# Patient Record
Sex: Female | Born: 1965 | Race: Black or African American | Hispanic: No | Smoking: Former smoker
Health system: Southern US, Community
[De-identification: ages and names within clinical notes are randomized; demographics above are authoritative.]

## PROBLEM LIST (undated history)

## (undated) DIAGNOSIS — E785 Hyperlipidemia, unspecified: Secondary | ICD-10-CM

## (undated) DIAGNOSIS — E119 Type 2 diabetes mellitus without complications: Secondary | ICD-10-CM

## (undated) DIAGNOSIS — I10 Essential (primary) hypertension: Secondary | ICD-10-CM

## (undated) DIAGNOSIS — K219 Gastro-esophageal reflux disease without esophagitis: Secondary | ICD-10-CM

## (undated) DIAGNOSIS — D219 Benign neoplasm of connective and other soft tissue, unspecified: Secondary | ICD-10-CM

## (undated) DIAGNOSIS — D649 Anemia, unspecified: Secondary | ICD-10-CM

## (undated) HISTORY — PX: WISDOM TOOTH EXTRACTION: SHX21

## (undated) HISTORY — PX: MYOMECTOMY: SHX85

## (undated) HISTORY — PX: ABDOMINAL HYSTERECTOMY: SHX81

---

## 2011-07-12 ENCOUNTER — Other Ambulatory Visit (HOSPITAL_COMMUNITY): Payer: Self-pay | Admitting: Internal Medicine

## 2011-07-12 DIAGNOSIS — Z1231 Encounter for screening mammogram for malignant neoplasm of breast: Secondary | ICD-10-CM

## 2011-08-02 ENCOUNTER — Ambulatory Visit (HOSPITAL_COMMUNITY)
Admission: RE | Admit: 2011-08-02 | Discharge: 2011-08-02 | Disposition: A | Discharge status: Home / Self Care | Payer: 59 | Source: Ambulatory Visit | Attending: Internal Medicine | Admitting: Internal Medicine

## 2011-08-02 DIAGNOSIS — Z1231 Encounter for screening mammogram for malignant neoplasm of breast: Secondary | ICD-10-CM | POA: Insufficient documentation

## 2012-08-15 ENCOUNTER — Other Ambulatory Visit (HOSPITAL_COMMUNITY): Payer: Self-pay | Admitting: Internal Medicine

## 2012-08-15 DIAGNOSIS — Z1231 Encounter for screening mammogram for malignant neoplasm of breast: Secondary | ICD-10-CM

## 2012-08-23 ENCOUNTER — Ambulatory Visit (HOSPITAL_COMMUNITY): Payer: 59

## 2012-09-10 ENCOUNTER — Ambulatory Visit (HOSPITAL_COMMUNITY)
Admission: RE | Admit: 2012-09-10 | Discharge: 2012-09-10 | Disposition: A | Discharge status: Home / Self Care | Payer: 59 | Source: Ambulatory Visit | Attending: Internal Medicine | Admitting: Internal Medicine

## 2012-09-10 DIAGNOSIS — Z1231 Encounter for screening mammogram for malignant neoplasm of breast: Secondary | ICD-10-CM | POA: Insufficient documentation

## 2012-09-18 ENCOUNTER — Other Ambulatory Visit: Payer: Self-pay | Admitting: Internal Medicine

## 2012-09-18 DIAGNOSIS — R928 Other abnormal and inconclusive findings on diagnostic imaging of breast: Secondary | ICD-10-CM

## 2012-09-19 HISTORY — PX: BREAST BIOPSY: SHX20

## 2012-09-27 ENCOUNTER — Ambulatory Visit (INDEPENDENT_AMBULATORY_CARE_PROVIDER_SITE_OTHER): Payer: Self-pay | Admitting: General Surgery

## 2012-09-28 ENCOUNTER — Ambulatory Visit
Admission: RE | Admit: 2012-09-28 | Discharge: 2012-09-28 | Disposition: A | Discharge status: Home / Self Care | Payer: Managed Care, Other (non HMO) | Source: Ambulatory Visit | Attending: Internal Medicine | Admitting: Internal Medicine

## 2012-09-28 ENCOUNTER — Other Ambulatory Visit: Payer: Self-pay | Admitting: Internal Medicine

## 2012-09-28 DIAGNOSIS — R928 Other abnormal and inconclusive findings on diagnostic imaging of breast: Secondary | ICD-10-CM

## 2012-10-19 ENCOUNTER — Ambulatory Visit
Admission: RE | Admit: 2012-10-19 | Discharge: 2012-10-19 | Disposition: A | Discharge status: Home / Self Care | Payer: Managed Care, Other (non HMO) | Source: Ambulatory Visit | Attending: Internal Medicine | Admitting: Internal Medicine

## 2012-10-19 DIAGNOSIS — R928 Other abnormal and inconclusive findings on diagnostic imaging of breast: Secondary | ICD-10-CM

## 2013-03-04 ENCOUNTER — Ambulatory Visit (INDEPENDENT_AMBULATORY_CARE_PROVIDER_SITE_OTHER): Payer: Self-pay | Admitting: General Surgery

## 2014-05-07 ENCOUNTER — Other Ambulatory Visit: Payer: Self-pay

## 2014-05-07 DIAGNOSIS — Z1231 Encounter for screening mammogram for malignant neoplasm of breast: Secondary | ICD-10-CM

## 2014-05-21 ENCOUNTER — Ambulatory Visit: Payer: Managed Care, Other (non HMO)

## 2014-05-21 ENCOUNTER — Ambulatory Visit
Admission: RE | Admit: 2014-05-21 | Discharge: 2014-05-21 | Disposition: A | Discharge status: Home / Self Care | Payer: Managed Care, Other (non HMO) | Source: Ambulatory Visit

## 2014-05-21 DIAGNOSIS — Z1231 Encounter for screening mammogram for malignant neoplasm of breast: Secondary | ICD-10-CM

## 2015-04-29 ENCOUNTER — Other Ambulatory Visit: Payer: Self-pay

## 2015-04-29 DIAGNOSIS — Z1231 Encounter for screening mammogram for malignant neoplasm of breast: Secondary | ICD-10-CM

## 2015-05-26 ENCOUNTER — Ambulatory Visit: Payer: Managed Care, Other (non HMO)

## 2015-05-29 ENCOUNTER — Ambulatory Visit: Payer: Managed Care, Other (non HMO)

## 2015-07-08 ENCOUNTER — Ambulatory Visit
Admission: RE | Admit: 2015-07-08 | Discharge: 2015-07-08 | Disposition: A | Discharge status: Home / Self Care | Payer: Managed Care, Other (non HMO) | Source: Ambulatory Visit

## 2015-07-08 DIAGNOSIS — Z1231 Encounter for screening mammogram for malignant neoplasm of breast: Secondary | ICD-10-CM

## 2016-04-29 ENCOUNTER — Other Ambulatory Visit: Payer: Self-pay | Admitting: Obstetrics and Gynecology

## 2016-05-03 LAB — CYTOLOGY - PAP

## 2016-09-02 ENCOUNTER — Other Ambulatory Visit: Payer: Self-pay | Admitting: Obstetrics and Gynecology

## 2016-09-02 DIAGNOSIS — Z1231 Encounter for screening mammogram for malignant neoplasm of breast: Secondary | ICD-10-CM

## 2016-09-08 ENCOUNTER — Ambulatory Visit
Admission: RE | Admit: 2016-09-08 | Discharge: 2016-09-08 | Disposition: A | Discharge status: Home / Self Care | Payer: Managed Care, Other (non HMO) | Source: Ambulatory Visit | Attending: Obstetrics and Gynecology | Admitting: Obstetrics and Gynecology

## 2016-09-08 DIAGNOSIS — Z1231 Encounter for screening mammogram for malignant neoplasm of breast: Secondary | ICD-10-CM

## 2016-11-09 ENCOUNTER — Other Ambulatory Visit: Payer: Self-pay | Admitting: Obstetrics and Gynecology

## 2016-11-10 ENCOUNTER — Other Ambulatory Visit: Payer: Self-pay | Admitting: Obstetrics and Gynecology

## 2016-11-10 DIAGNOSIS — D251 Intramural leiomyoma of uterus: Secondary | ICD-10-CM

## 2017-08-23 ENCOUNTER — Other Ambulatory Visit: Payer: Self-pay | Admitting: Obstetrics and Gynecology

## 2017-08-23 DIAGNOSIS — Z1231 Encounter for screening mammogram for malignant neoplasm of breast: Secondary | ICD-10-CM

## 2017-09-22 ENCOUNTER — Ambulatory Visit
Admission: RE | Admit: 2017-09-22 | Discharge: 2017-09-22 | Disposition: A | Discharge status: Home / Self Care | Payer: No Typology Code available for payment source | Source: Ambulatory Visit | Attending: Obstetrics and Gynecology | Admitting: Obstetrics and Gynecology

## 2017-09-22 DIAGNOSIS — Z1231 Encounter for screening mammogram for malignant neoplasm of breast: Secondary | ICD-10-CM

## 2018-04-09 ENCOUNTER — Ambulatory Visit: Payer: Self-pay | Admitting: General Surgery

## 2018-04-27 ENCOUNTER — Encounter: Payer: Self-pay | Admitting: Physician Assistant

## 2018-09-05 ENCOUNTER — Other Ambulatory Visit: Payer: Self-pay

## 2018-09-05 ENCOUNTER — Emergency Department (HOSPITAL_COMMUNITY)
Admission: EM | Admit: 2018-09-05 | Discharge: 2018-09-06 | Disposition: A | Discharge status: Home / Self Care | Payer: 59 | Attending: Emergency Medicine | Admitting: Emergency Medicine

## 2018-09-05 ENCOUNTER — Emergency Department (HOSPITAL_COMMUNITY): Payer: 59

## 2018-09-05 ENCOUNTER — Encounter (HOSPITAL_COMMUNITY): Payer: Self-pay | Admitting: Emergency Medicine

## 2018-09-05 DIAGNOSIS — Y92810 Car as the place of occurrence of the external cause: Secondary | ICD-10-CM | POA: Diagnosis not present

## 2018-09-05 DIAGNOSIS — Y998 Other external cause status: Secondary | ICD-10-CM | POA: Insufficient documentation

## 2018-09-05 DIAGNOSIS — S6992XA Unspecified injury of left wrist, hand and finger(s), initial encounter: Secondary | ICD-10-CM

## 2018-09-05 DIAGNOSIS — S62613B Displaced fracture of proximal phalanx of left middle finger, initial encounter for open fracture: Secondary | ICD-10-CM | POA: Diagnosis not present

## 2018-09-05 DIAGNOSIS — Y9389 Activity, other specified: Secondary | ICD-10-CM | POA: Insufficient documentation

## 2018-09-05 DIAGNOSIS — W231XXA Caught, crushed, jammed, or pinched between stationary objects, initial encounter: Secondary | ICD-10-CM | POA: Insufficient documentation

## 2018-09-05 DIAGNOSIS — S62633B Displaced fracture of distal phalanx of left middle finger, initial encounter for open fracture: Secondary | ICD-10-CM

## 2018-09-05 MED ORDER — TETANUS-DIPHTH-ACELL PERTUSSIS 5-2.5-18.5 LF-MCG/0.5 IM SUSP
0.5000 mL | Freq: Once | INTRAMUSCULAR | Status: AC
  Administered 2018-09-05: 0.5 mL via INTRAMUSCULAR
  Filled 2018-09-05: qty 0.5

## 2018-09-05 MED ORDER — CEFAZOLIN SODIUM-DEXTROSE 2-4 GM/100ML-% IV SOLN
2.0000 g | Freq: Once | INTRAVENOUS | Status: AC
Start: 2018-09-06 — End: 2018-09-06
  Administered 2018-09-06: 2 g via INTRAVENOUS
  Filled 2018-09-05: qty 100

## 2018-09-05 MED ORDER — LIDOCAINE HCL 2 % IJ SOLN
15.0000 mL | Freq: Once | INTRAMUSCULAR | Status: AC
  Administered 2018-09-05: 300 mg
  Filled 2018-09-05: qty 20

## 2018-09-05 NOTE — ED Triage Notes (Signed)
Pt arriving POV with injury to left middle finger. Pt states she got her finger stuck between her driver seat and the center console. Nail is bent up from middle to cuticle area.

## 2018-09-05 NOTE — ED Provider Notes (Signed)
[facility] DEPT Provider Note   CSN: [id number] Arrival date & time: 09/05/18  2027    History   Chief Complaint Chief Complaint  Patient presents with  . Finger Injury    HPI Patricia Mckee is a 52 y.o. female.  52 year old right-hand-dominant female with no significant past medical history presents to the emergency department for injury to her left third finger.  States that she got her finger stuck between the driver seat and her center console.  Felt the nail, and contact with a sharp metal edge.  Has had a burning pain which has waxed and waned in severity.  Notes disruption to her nail.  Injury has remained constant with no medications taken prior to arrival.  She is not on chronic anticoagulation.  No sensation changes such as numbness or paresthesias.  She cannot recall the date of her last tetanus shot.     History reviewed. No pertinent past medical history.  There are no active problems to display for this patient.   History reviewed. No pertinent surgical history.   OB History   No obstetric history on file.      Home Medications    Prior to Admission medications   Medication Sig Start Date End Date Taking? Authorizing Provider  ibuprofen (ADVIL,MOTRIN) 200 MG tablet Take 200 mg by mouth every 6 (six) hours as needed for moderate pain.   Yes [provider]  losartan-hydrochlorothiazide (HYZAAR) 100-25 MG tablet Take 1 tablet by mouth daily.   Yes [provider]  cephALEXin (KEFLEX) 500 MG capsule Take 1 capsule (500 mg total) by mouth 4 (four) times daily. 09/06/18   Antonietta Breach, PA-C  HYDROcodone-acetaminophen (NORCO/VICODIN) 5-325 MG tablet Take 1 tablet by mouth every 6 (six) hours as needed for severe pain. 09/06/18   Antonietta Breach, PA-C    Family History No family history on file.  Social History Social History   Tobacco Use  . Smoking status: Not on file  Substance Use Topics  . Alcohol  use: Not on file  . Drug use: Not on file     Allergies   Patient has no known allergies.   Review of Systems Review of Systems Ten systems reviewed and are negative for acute change, except as noted in the HPI.    Physical Exam Updated Vital Signs BP 132/89   Pulse 87   Temp 98 F (36.7 C) (Oral)   Resp 18   SpO2 100%   Physical Exam Vitals signs and nursing note reviewed.  Constitutional:      General: She is not in acute distress.    Appearance: She is well-developed. She is not diaphoretic.     Comments: Nontoxic appearing and in NAD  HENT:     Head: Normocephalic and atraumatic.  Eyes:     General: No scleral icterus.    Conjunctiva/sclera: Conjunctivae normal.  Neck:     Musculoskeletal: Normal range of motion.  Cardiovascular:     Rate and Rhythm: Normal rate and regular rhythm.     Pulses: Normal pulses.     Comments: Distal radial pulse 2+ in the LUE. Capillary refill brisk in all digits of the L hand. Pulmonary:     Effort: Pulmonary effort is normal. No respiratory distress.     Comments: Respirations even and unlabored Musculoskeletal: Normal range of motion.     Comments: Complete disruption of the nail body, root, and nail matrix of the L 3rd finger. There is bleeding around  the eponychium. 1cm laceration to the fat pad of the affected digit as well with slow bleeding. See imaging below for further detail.  Skin:    General: Skin is warm and dry.     Coloration: Skin is not pale.     Findings: No erythema or rash.  Neurological:     Mental Status: She is alert and oriented to person, place, and time.     Comments: Sensation to light touch intact in all digits of the L hand.  Psychiatric:        Behavior: Behavior normal.           ED Treatments / Results  Labs (all labs ordered are listed, but only abnormal results are displayed) Labs Reviewed - No data to display  EKG None  Radiology Dg Finger Middle Left  Result Date:  09/05/2018 CLINICAL DATA:  Injury to the left middle finger. Finger was stuck between driver seat and center console. EXAM: LEFT MIDDLE FINGER 2+V COMPARISON:  None. FINDINGS: Transverse fracture of the midportion distal phalanx left third finger. There is dorsal displacement and volar angulation of the distal fracture fragment. Evaluation of soft tissues is limited by overlying gauze material but there appears to be a soft tissue laceration in the distal finger as well. No evidence of intra-articular involvement. No displacement at the joints. IMPRESSION: Transverse fracture of the midportion distal phalanx left third finger with dorsal displacement and volar angulation of the distal fracture fragment. Electronically Signed   By: Lucienne Capers M.D.   On: 09/05/2018 22:46    Procedures Procedures (including critical care time)  Medications Ordered in ED Medications  Tdap (BOOSTRIX) injection 0.5 mL (0.5 mLs Intramuscular Given 09/05/18 2306)  lidocaine (XYLOCAINE) 2 % (with pres) injection 300 mg (300 mg Infiltration Given by Other 09/05/18 2305)  ceFAZolin (ANCEF) IVPB 2g/100 mL premix (0 g Intravenous Stopped 09/06/18 0304)     Initial Impression / Assessment and Plan / ED Course  I have reviewed the triage vital signs and the nursing notes.  Pertinent labs & imaging results that were available during my care of the patient were reviewed by me and considered in my medical decision making (see chart for details).       10:20 PM Patient presenting for injury to L 3rd middle finger after getting her car stuck between her seat and center console. Obvious nailbed injury. Patient neurovascularly intact. Brisk capillary refill. Will obtain Xray and update Tdap.  10:52 PM Xray with evidence of transverse fracture to the distal phalanx; displaced. Evidence that fracture is open. Will consult with hand surgery regarding care and management. Order placed.  11:40 PM Case discussed with Dr.  Grandville Silos of hand surgery who will review images and xray and call back with management recommendations.  11:53 PM Dr. Grandville Silos has reviewed the patient's images and xrays. Advises vigorous wash out with digital block and reduction of displaced nail and fracture fragment with splinting. Will follow up with patient in the office at [facility] tomorrow. Patient to be given Ancef prior to discharge as well as course of Keflex 500mg  QID.  1:12 AM Attempted reduction for an hour without success. Also attempted with Johnney Killian, MD. Dr. Grandville Silos made aware. Will see patient in the ED.  2:00 AM Dr. Grandville Silos has repaired the patient's complex injury at bedside. Plan for d/c after Ancef on course of Keflex QID. She will plan to see Dr. Grandville Silos in outpatient follow up during the week of 09/17/18.   Final  Clinical Impressions(s) / ED Diagnoses   Final diagnoses:  Displaced fracture of distal phalanx of left middle finger, initial encounter for open fracture  Injury of nail bed of finger of left hand, initial encounter    ED Discharge Orders         Ordered    cephALEXin (KEFLEX) 500 MG capsule  4 times daily     09/06/18 0253    HYDROcodone-acetaminophen (NORCO/VICODIN) 5-325 MG tablet  Every 6 hours PRN     09/06/18 0253           Antonietta Breach, PA-C 09/06/18 0327    Sherwood Gambler, MD 09/08/18 [phone]

## 2018-09-06 MED ORDER — HYDROCODONE-ACETAMINOPHEN 5-325 MG PO TABS: 1.0000 | ORAL_TABLET | Freq: Four times a day (QID) | ORAL | 0 refills | Status: DC | PRN

## 2018-09-06 MED ORDER — CEPHALEXIN 500 MG PO CAPS: 500.0000 mg | ORAL_CAPSULE | Freq: Four times a day (QID) | ORAL | 0 refills | Status: DC

## 2018-09-06 NOTE — Discharge Instructions (Addendum)
Discharge Instructions   You have a dressing with a plaster splint incorporated in it. Move your fingers like I showed you Leave the dressing in place until you return to our office.  You may shower, but keep the bandage clean & dry.  You may drive a car when you are off of prescription pain medications and can safely control your vehicle with both hands. Take Keflex as prescribed until finished Our office will call you to arrange follow-up   Please call [phone] during normal business hours or [phone] after hours for any problems. Including the following:  - excessive redness of the incisions - drainage for more than 4 days - fever of more than 101.5 F  *Please note that pain medications will not be refilled after hours or on weekends.

## 2018-09-06 NOTE — Consult Note (Signed)
ORTHOPAEDIC CONSULTATION HISTORY & PHYSICAL REQUESTING PHYSICIAN: No att. providers found  Chief Complaint: Left long fingertip injury  HPI: Patricia Mckee is a 52 y.o. female who presented to the emergency department for evaluation, after left long finger was injured between the seat and the center console of her vehicle.  She was noted to have a nailbed injury with some disruption and underlying P3 fracture.  Attempted closed reduction by ED staff was unsuccessful.  Tetanus has been updated.  She is receiving a dose of IV antibiotics.  History reviewed. No pertinent past medical history. History reviewed. No pertinent surgical history. Social History   Socioeconomic History  . Marital status: Married    Spouse name: Not on file  . Number of children: Not on file  . Years of education: Not on file  . Highest education level: Not on file  Occupational History  . Not on file  Social Needs  . Financial resource strain: Not on file  . Food insecurity:    Worry: Not on file    Inability: Not on file  . Transportation needs:    Medical: Not on file    Non-medical: Not on file  Tobacco Use  . Smoking status: Not on file  Substance and Sexual Activity  . Alcohol use: Not on file  . Drug use: Not on file  . Sexual activity: Not on file  Lifestyle  . Physical activity:    Days per week: Not on file    Minutes per session: Not on file  . Stress: Not on file  Relationships  . Social connections:    Talks on phone: Not on file    Gets together: Not on file    Attends religious service: Not on file    Active member of club or organization: Not on file    Attends meetings of clubs or organizations: Not on file    Relationship status: Not on file  Other Topics Concern  . Not on file  Social History Narrative  . Not on file   No family history on file. No Known Allergies Prior to Admission medications   Medication Sig Start Date End Date Taking? Authorizing Provider    ibuprofen (ADVIL,MOTRIN) 200 MG tablet Take 200 mg by mouth every 6 (six) hours as needed for moderate pain.   Yes [provider]  losartan-hydrochlorothiazide (HYZAAR) 100-25 MG tablet Take 1 tablet by mouth daily.   Yes [provider]   Dg Finger Middle Left  Result Date: 09/05/2018 CLINICAL DATA:  Injury to the left middle finger. Finger was stuck between driver seat and center console. EXAM: LEFT MIDDLE FINGER 2+V COMPARISON:  None. FINDINGS: Transverse fracture of the midportion distal phalanx left third finger. There is dorsal displacement and volar angulation of the distal fracture fragment. Evaluation of soft tissues is limited by overlying gauze material but there appears to be a soft tissue laceration in the distal finger as well. No evidence of intra-articular involvement. No displacement at the joints. IMPRESSION: Transverse fracture of the midportion distal phalanx left third finger with dorsal displacement and volar angulation of the distal fracture fragment. Electronically Signed   By: Lucienne Capers M.D.   On: 09/05/2018 22:46    Positive ROS: All other systems have been reviewed and were otherwise negative with the exception of those mentioned in the HPI and as above.  Physical Exam: Vitals: Refer to EMR. Constitutional:  WD, WN, NAD HEENT:  NCAT, EOMI Neuro/Psych:  Alert & oriented to  person, place, and time; appropriate mood & affect Lymphatic: No generalized extremity edema or lymphadenopathy Extremities / MSK:  The extremities are normal with respect to appearance, ranges of motion, joint stability, muscle strength/tone, sensation, & perfusion except as otherwise noted:  The digit is numb, already having had digital block with lidocaine.  The base of the nail is disrupted from underneath the eponychial fold, and resting on top of it.  The normal nail plate is covered with an acrylic nail.  The P3 fracture is visible.  Assessment: Displaced angulated  open P3 fracture of the left long finger, with overlying nailbed injury  Plan/Procedure: I discussed these findings with her and indicated the need for further treatment to affect cleansing, reduction, and stabilization of the fracture and repair of the nailbed.  She consented.  A tourniquet was applied to the base of the digit.  The digit was then prepped with Betadine and draped in usual sterile fashion.  The nail plate was then fully removed from the nailbed.  Once this was done, the wound and the fracture were copiously irrigated.  The distal edge of the eponychial fold was lifted to allow for reduction of the fracture and the nailbed placed grossly in its native position.  The germinal matrix actually had yielded at its very base.  For this reason, I treated 2 incisions through the eponychial fold, one medial and one lateral to allow for the fold to be further elevated proximally.  This having been done, 5-0 plain gut was used to secure the proximal aspect of the germinal matrix as far proximally as it would go, in its native position and this was secured with the single suture on either corner.  Once this was done, the eponychial fold was placed back into place and secured with 5-0 plain gut suture.  The nail plate that had been removed was then cleaned up, trimmed, and placed back on top of the nail matrix, advanced under the eponychial fold and secured to it with a suture.  The distal end was then secured with a spanning suture across to help hold it down since my needle would not perforate the acrylic nail.  This nicely allowed for the nail to provide a splint and stability for the underlying soft tissue repair given that there was a fracture.  Additionally, a 22-gauge hypodermic needle was driven to the tip of the digit intramedullary and into the base, but not across the DIP joint.  This provided further stability.  The hub of the needle was cut off and the needle bent back up over the acrylic nail.   The tourniquet was removed, the digit cleansed and dressed with Xeroform, gauze, and a tongue blade splint that allowed the PIP and MP joints to move.  She will be discharged home today with typical instructions, keeping the splint on, clean, and dry until returning to the office the week of 12-30.  Pain management strategies were reviewed and she will be discharged with oral antibiotics.  When she returns, there should be x-rays of the left long finger in the splint, and I then can carefully remove the splint to prevent disruption of the needle fixation.  Patricia Mckee, [place], [place]  [address] Office: [phone] Mobile: [phone]  09/06/2018, 2:29 AM

## 2018-09-06 NOTE — ED Notes (Addendum)
Was returning to the floor and met the pt in the hallway. Pt immediately expressed that she was frustrated with how long she had to wait. Stating "I shouldn't be waiting this long; someone should have taken the bandage off my finger sooner; I dont understand why nothing is being done." I explained to the pt that the PA was awaiting consultation from the hand specialist before she preceded forward with action. Pt was not satisfyed with the answer stating "that's an excuse, if I went anywhere else they I would have been treated faster." Pt also stated "I heard [facility] had a reputation, and I starting to understand it, and I will make sure others know too." Once again I explained to the pt that we were awaiting the consultation from the hand specialist and that she was not being ignored or forgotten. The PA  [facility] then showed up, and once again explained to the pt what the situation, and the delay in care.

## 2019-04-15 ENCOUNTER — Other Ambulatory Visit: Payer: Self-pay | Admitting: Obstetrics and Gynecology

## 2019-04-15 DIAGNOSIS — Z1231 Encounter for screening mammogram for malignant neoplasm of breast: Secondary | ICD-10-CM

## 2019-05-30 ENCOUNTER — Ambulatory Visit
Admission: RE | Admit: 2019-05-30 | Discharge: 2019-05-30 | Disposition: A | Discharge status: Home / Self Care | Payer: No Typology Code available for payment source | Source: Ambulatory Visit | Attending: Obstetrics and Gynecology | Admitting: Obstetrics and Gynecology

## 2019-05-30 ENCOUNTER — Other Ambulatory Visit: Payer: Self-pay

## 2019-05-30 DIAGNOSIS — Z1231 Encounter for screening mammogram for malignant neoplasm of breast: Secondary | ICD-10-CM

## 2019-06-03 ENCOUNTER — Ambulatory Visit: Payer: Self-pay | Admitting: General Surgery

## 2019-06-03 NOTE — H&P (Signed)
History of Present Illness Ralene Ok MD; 06/03/2019 10:28 AM) The patient is a 53 year old female who presents with an incisional hernia. Patient follow back up today secondary to incisional hernia. Patient states that secondary to That she put the partial hysterectomy and hernia repair. Patient states that she is ready to schedule surgery. Patient's had no change in the hernia. She has no signs or symptoms of incarceration or strangulation.  -------------------------------------------------------------- Referred by: Dr. Philis Pique Chief Complaint: Incisional hernia  Patient is a 53 year old female with a history of a robotic myomectomy approximately 10 years ago and was DC'd. Patient states that approximately 2 years after she did notice a small bulge at the suprapubic umbilical port site. Patient also developed further fibroids is to have a partial hysterectomy. Patient states that she has minimal discomfort from the hernia site. She states that she is able to reduce it manually. She is active and does do a lot of traveling with her work. She denies any signs or symptoms of incarceration or strangulation.  Patient Dr. Philis Pique plan on proceeding with robotic partial hysterectomy.    Allergies Emeline Gins, [place]; 06/03/2019 10:13 AM) No Known Drug Allergies  [04/09/2018]: Allergies Reconciled   Medication History Emeline Gins, CMA; 06/03/2019 10:13 AM) Losartan Potassium-HCTZ (100-25MG  Tablet, Oral) Active. Medications Reconciled    Review of Systems Ralene Ok, MD; 06/03/2019 10:29 AM) General Not Present- Appetite Loss, Chills, Fatigue, Fever, Night Sweats, Weight Gain and Weight Loss. Skin Not Present- Change in Wart/Mole, Dryness, Hives, Jaundice, New Lesions, Non-Healing Wounds, Rash and Ulcer. HEENT Present- Seasonal Allergies and Wears glasses/contact lenses. Not Present- Earache, Hearing Loss, Hoarseness, Nose Bleed, Oral Ulcers, Ringing in the Ears,  Sinus Pain, Sore Throat, Visual Disturbances and Yellow Eyes. Respiratory Not Present- Bloody sputum, Chronic Cough, Difficulty Breathing, Snoring and Wheezing. Breast Not Present- Breast Mass, Breast Pain, Nipple Discharge and Skin Changes. Cardiovascular Not Present- Chest Pain, Difficulty Breathing Lying Down, Leg Cramps, Palpitations, Rapid Heart Rate, Shortness of Breath and Swelling of Extremities. Gastrointestinal Present- Change in Bowel Habits. Not Present- Abdominal Pain, Bloating, Bloody Stool, Chronic diarrhea, Constipation, Difficulty Swallowing, Excessive gas, Gets full quickly at meals, Hemorrhoids, Indigestion, Nausea, Rectal Pain and Vomiting. Female Genitourinary Present- Frequency. Not Present- Nocturia, Painful Urination, Pelvic Pain and Urgency. Neurological Not Present- Decreased Memory, Fainting, Headaches, Numbness, Seizures, Tingling, Tremor, Trouble walking and Weakness. Psychiatric Not Present- Anxiety, Bipolar, Change in Sleep Pattern, Depression, Fearful and Frequent crying. Endocrine Not Present- Cold Intolerance, Excessive Hunger, Hair Changes, Heat Intolerance, Hot flashes and New Diabetes. Hematology Not Present- Blood Thinners, Easy Bruising, Excessive bleeding, Gland problems, HIV and Persistent Infections. All other systems negative  Vitals Emeline Gins CMA; 06/03/2019 10:13 AM) 06/03/2019 10:13 AM Weight: 186.8 lb Height: 67in Body Surface Area: 1.96 m Body Mass Index: 29.26 kg/m  Temp.: 97.52F  Pulse: 110 (Regular)  BP: 124/82(Sitting, Left Arm, Standard)       Physical Exam Ralene Ok MD; 06/03/2019 10:29 AM) The physical exam findings are as follows: Note: Constitutional: No acute distress, conversant, appears stated age  Eyes: Anicteric sclerae, moist conjunctiva, no lid lag  Neck: No thyromegaly, trachea midline, no cervical lymphadenopathy  Lungs: Clear to auscultation biilaterally, normal respiratory  effot  Cardiovascular: regular rate & rhythm, no murmurs, no peripheal edema, pedal pulses 2+  GI: Soft, no masses or hepatosplenomegaly, non-tender to palpation  MSK: Normal gait, no clubbing cyanosis, edema  Skin: No rashes, palpation reveals normal skin turgor  Psychiatric: Appropriate judgment and insight, oriented to  person, place, and time  Abdomen Inspection Hernias - Incisional - Reducible (In the midline superior to the umbilicus, about 1-2 cm) .    Assessment & Plan Ralene Ok MD; 06/03/2019 10:29 AM)  Fatima Blank HERNIA, WITHOUT OBSTRUCTION OR GANGRENE (K43.2) Impression: Patient is a 53 year old female with an incisional hernia, fibroids to undergo robotic partial hysterectomy. The patient will like to try to coordinate the incisional hernia repair with mesh the same time as the partial hysterectomy.  1. Will proceed to the operating room for laparoscopic versus robotic incisional hernia repair with mesh. 2. I discussed with the patient the signs and symptoms of incarceration and strangulation and the need to proceed to the ER should they occur.  3. I discussed with the patient the risks and benefits of the procedure to include but not limited to: Infection, bleeding, damage to surrounding structures, possible need for further surgery, possible nerve pain, and possible recurrence. The patient was understanding and wishes to proceed.

## 2019-06-12 ENCOUNTER — Encounter (HOSPITAL_BASED_OUTPATIENT_CLINIC_OR_DEPARTMENT_OTHER): Payer: Self-pay

## 2019-06-12 ENCOUNTER — Ambulatory Visit (HOSPITAL_BASED_OUTPATIENT_CLINIC_OR_DEPARTMENT_OTHER): Admit: 2019-06-12 | Payer: No Typology Code available for payment source | Admitting: Obstetrics and Gynecology

## 2019-06-12 SURGERY — XI ROBOTIC ASSISTED LAPAROSCOPIC HYSTERECTOMY AND SALPINGECTOMY
Anesthesia: Choice | Laterality: Bilateral

## 2019-08-27 ENCOUNTER — Ambulatory Visit (HOSPITAL_BASED_OUTPATIENT_CLINIC_OR_DEPARTMENT_OTHER): Admit: 2019-08-27 | Payer: No Typology Code available for payment source | Admitting: Obstetrics and Gynecology

## 2019-08-27 ENCOUNTER — Encounter (HOSPITAL_BASED_OUTPATIENT_CLINIC_OR_DEPARTMENT_OTHER): Payer: Self-pay

## 2019-08-27 SURGERY — XI ROBOTIC ASSISTED LAPAROSCOPIC HYSTERECTOMY AND SALPINGECTOMY
Anesthesia: Choice

## 2020-04-28 ENCOUNTER — Other Ambulatory Visit: Payer: Self-pay | Admitting: Obstetrics and Gynecology

## 2020-06-09 ENCOUNTER — Encounter (HOSPITAL_COMMUNITY): Payer: Self-pay

## 2020-06-09 NOTE — Progress Notes (Addendum)
COVID Vaccine Completed:  x2 Date COVID Vaccine completed:  12-12-19 & 01-08-20 COVID vaccine manufacturer: [place]   PCP - [facility] Cardiologist - N/a  Chest x-ray -  EKG - 06-10-20 in Epic Stress Test -  ECHO -  Cardiac Cath -  Pacemaker/ICD device last checked:  Sleep Study -  CPAP -   Fasting Blood Sugar - 124-148 Checks Blood Sugar -two times a day  Blood Thinner Instructions: Aspirin Instructions:  ASA 81 mg.  Pt to check with surgeon Last Dose:  Anesthesia review:   Patient denies shortness of breath, fever, cough and chest pain at PAT appointment   Patient verbalized understanding of instructions that were given to them at the PAT appointment. Patient was also instructed that they will need to review over the PAT instructions again at home before surgery.

## 2020-06-09 NOTE — Patient Instructions (Addendum)
DUE TO COVID-19 ONLY ONE VISITOR IS ALLOWED IN WAITING ROOM (VISITOR WILL HAVE A TEMPERATURE CHECK ON ARRIVAL AND MUST WEAR A FACE MASK THE ENTIRE TIME.)  ONCE YOU ARE ADMITTED TO YOUR PRIVATE ROOM, THE SAME ONE VISITOR IS ALLOWED TO VISIT DURING VISITING HOURS ONLY.  Your COVID swab testing is scheduled for Friday, 06-12-20 at 10:00 AM ,   You must self quarantine after your testing per handout given to you at the testing site. [address] [place], [place] [address]  (Must self quarantine after testing. Follow instructions on handout.)       Your procedure is scheduled on: Tuesday, 06-16-20  Report to [facility] AT 7:00 A. M.   Call this number if you have problems the morning of surgery:[phone].   OUR ADDRESS IS [address].  WE ARE LOCATED IN [facility].                                     REMEMBER:  DO NOT EAT FOOD AFTER MIDNIGHT .   YOU MAY HAVE CLEAR LIQUIDS FROM MIDNIGHT UNTIL 6:00 AM   CLEAR LIQUID DIET  Foods Allowed                                                                     Foods Excluded  Water, Black Coffee and tea, regular and decaf          liquids that you cannot  Plain Jell-O in any flavor  (No red)                                see through such as: Fruit ices (not with fruit pulp)                                      milk, soups, orange juice              Iced Popsicles (No red)                                      All solid food                                   Apple juices Sports drinks like Gatorade (No red) Lightly seasoned clear broth or consume(fat free) Sugar, honey syrup  BRUSH YOUR TEETH THE MORNING OF SURGERY.  TAKE THESE MEDICATIONS MORNING OF SURGERY WITH A SIP OF WATER:  None  How to Manage Your Diabetes Before and After Surgery  Why is it important to control my blood sugar before and after surgery? . Improving blood sugar levels before and after surgery helps healing and can limit  problems. . A way of improving blood sugar control is eating a healthy diet by: o  Eating less sugar and carbohydrates o  Increasing activity/exercise o  Talking with your doctor about reaching your blood sugar goals . High blood sugars (greater than 180 mg/dL) can raise your risk of infections and slow your recovery, so you will need to focus on controlling your diabetes during the weeks before surgery. . Make sure that the doctor who takes care of your diabetes knows about your planned surgery including the date and location.  How do I manage my blood sugar before surgery? . Check your blood sugar at least 4 times a day, starting 2 days before surgery, to make sure that the level is not too high or low. o Check your blood sugar the morning of your surgery when you wake up and every 2 hours until you get to the Short Stay unit. . If your blood sugar is less than 70 mg/dL, you will need to treat for low blood sugar: o Do not take insulin. o Treat a low blood sugar (less than 70 mg/dL) with  cup of clear juice (cranberry or apple), 4 glucose tablets, OR glucose gel. o Recheck blood sugar in 15 minutes after treatment (to make sure it is greater than 70 mg/dL). If your blood sugar is not greater than 70 mg/dL on recheck, call [phone] for further instructions. . Report your blood sugar to the short stay nurse when you get to Short Stay.  . If you are admitted to the hospital after surgery: o Your blood sugar will be checked by the staff and you will probably be given insulin after surgery (instead of oral diabetes medicines) to make sure you have good blood sugar levels. o The goal for blood sugar control after surgery is 80-180 mg/dL.   WHAT DO I DO ABOUT MY DIABETES MEDICATION?  Marland Kitchen Do not take oral diabetes medicines (pills) the morning of surgery.  . THE DAY BEFORE SURGERY:  Do not take Jardiance. -         Take Metformin as prescribed       . THE MORNING OF SURGERY:  Do not take  Jardiance or Metformin.    Reviewed and Endorsed by [facility] Patient Education Committee, August 2015   DO NOT WEAR JEWERLY, MAKE UP, OR NAIL POLISH.  DO NOT WEAR LOTIONS, POWDERS, PERFUMES/COLOGNE OR DEODORANT.  DO NOT SHAVE FOR 24 HOURS PRIOR TO DAY OF SURGERY.  MEN MAY SHAVE FACE AND NECK.  CONTACTS, GLASSES, OR DENTURES MAY NOT BE WORN TO SURGERY.                                    [facility] IS NOT RESPONSIBLE  FOR ANY BELONGINGS.                                                            Please read over the following fact sheets you were given: IF YOU HAVE QUESTIONS ABOUT YOUR PRE OP INSTRUCTIONS PLEASE CALL [phone]   [facility] - Preparing for Surgery Before surgery, you can play an important role.  Because skin is not sterile, your skin needs to be as free of germs as possible.  You can reduce the number of germs on your skin by washing with CHG (chlorahexidine gluconate) soap before surgery.  CHG is an antiseptic cleaner which  kills germs and bonds with the skin to continue killing germs even after washing. Please DO NOT use if you have an allergy to CHG or antibacterial soaps.  If your skin becomes reddened/irritated stop using the CHG and inform your nurse when you arrive at Short Stay. Do not shave (including legs and underarms) for at least 48 hours prior to the first CHG shower.  You may shave your face/neck.  Please follow these instructions carefully:  1.  Shower with CHG Soap the night before surgery and the  morning of surgery.  2.  If you choose to wash your hair, wash your hair first as usual with your normal  shampoo.  3.  After you shampoo, rinse your hair and body thoroughly to remove the shampoo.                             4.  Use CHG as you would any other liquid soap.  You can apply chg directly to the skin and wash.  Gently with a scrungie or clean washcloth.  5.  Apply the CHG Soap to your body ONLY FROM THE NECK DOWN.   Do   not use on face/  open                           Wound or open sores. Avoid contact with eyes, ears mouth and   genitals (private parts).                       Wash face,  Genitals (private parts) with your normal soap.             6.  Wash thoroughly, paying special attention to the area where your    surgery  will be performed.  7.  Thoroughly rinse your body with warm water from the neck down.  8.  DO NOT shower/wash with your normal soap after using and rinsing off the CHG Soap.                9.  Pat yourself dry with a clean towel.            10.  Wear clean pajamas.            11.  Place clean sheets on your bed the night of your first shower and do not  sleep with pets. Day of Surgery : Do not apply any lotions/deodorants the morning of surgery.  Please wear clean clothes to the hospital/surgery center.  FAILURE TO FOLLOW THESE INSTRUCTIONS MAY RESULT IN THE CANCELLATION OF YOUR SURGERY  PATIENT SIGNATURE_________________________________  NURSE SIGNATURE__________________________________  ________________________________________________________________________  WHAT IS A BLOOD TRANSFUSION? Blood Transfusion Information  A transfusion is the replacement of blood or some of its parts. Blood is made up of multiple cells which provide different functions.  Red blood cells carry oxygen and are used for blood loss replacement.  White blood cells fight against infection.  Platelets control bleeding.  Plasma helps clot blood.  Other blood products are available for specialized needs, such as hemophilia or other clotting disorders. BEFORE THE TRANSFUSION  Who gives blood for transfusions?   Healthy volunteers who are fully evaluated to make sure their blood is safe. This is blood bank blood. Transfusion therapy is the safest it has ever been in the practice of medicine. Before blood is taken from a donor, a complete  history is taken to make sure that person has no history of diseases nor engages in  risky social behavior (examples are intravenous drug use or sexual activity with multiple partners). The donor's travel history is screened to minimize risk of transmitting infections, such as malaria. The donated blood is tested for signs of infectious diseases, such as HIV and hepatitis. The blood is then tested to be sure it is compatible with you in order to minimize the chance of a transfusion reaction. If you or a relative donates blood, this is often done in anticipation of surgery and is not appropriate for emergency situations. It takes many days to process the donated blood. RISKS AND COMPLICATIONS Although transfusion therapy is very safe and saves many lives, the main dangers of transfusion include:   Getting an infectious disease.  Developing a transfusion reaction. This is an allergic reaction to something in the blood you were given. Every precaution is taken to prevent this. The decision to have a blood transfusion has been considered carefully by your caregiver before blood is given. Blood is not given unless the benefits outweigh the risks. AFTER THE TRANSFUSION  Right after receiving a blood transfusion, you will usually feel much better and more energetic. This is especially true if your red blood cells have gotten low (anemic). The transfusion raises the level of the red blood cells which carry oxygen, and this usually causes an energy increase.  The nurse administering the transfusion will monitor you carefully for complications. HOME CARE INSTRUCTIONS  No special instructions are needed after a transfusion. You may find your energy is better. Speak with your caregiver about any limitations on activity for underlying diseases you may have. SEEK MEDICAL CARE IF:   Your condition is not improving after your transfusion.  You develop redness or irritation at the intravenous (IV) site. SEEK IMMEDIATE MEDICAL CARE IF:  Any of the following symptoms occur over the next 12  hours:  Shaking chills.  You have a temperature by mouth above 102 F (38.9 C), not controlled by medicine.  Chest, back, or muscle pain.  People around you feel you are not acting correctly or are confused.  Shortness of breath or difficulty breathing.  Dizziness and fainting.  You get a rash or develop hives.  You have a decrease in urine output.  Your urine turns a dark color or changes to pink, red, or brown. Any of the following symptoms occur over the next 10 days:  You have a temperature by mouth above 102 F (38.9 C), not controlled by medicine.  Shortness of breath.  Weakness after normal activity.  The white part of the eye turns yellow (jaundice).  You have a decrease in the amount of urine or are urinating less often.  Your urine turns a dark color or changes to pink, red, or brown. Document Released: 09/02/2000 Document Revised: 11/28/2011 Document Reviewed: 04/21/2008 [facility] Patient Information 2014 [place], [place].  _______________________________________________________________________

## 2020-06-09 NOTE — Progress Notes (Signed)
Please place orders for PAT visit scheduled for 06-10-20.

## 2020-06-10 ENCOUNTER — Encounter (HOSPITAL_COMMUNITY): Payer: Self-pay

## 2020-06-10 ENCOUNTER — Encounter (HOSPITAL_COMMUNITY)
Admission: RE | Admit: 2020-06-10 | Discharge: 2020-06-10 | Disposition: A | Discharge status: Home / Self Care | Payer: No Typology Code available for payment source | Source: Ambulatory Visit | Attending: Obstetrics and Gynecology | Admitting: Obstetrics and Gynecology

## 2020-06-10 ENCOUNTER — Other Ambulatory Visit: Payer: Self-pay

## 2020-06-10 ENCOUNTER — Ambulatory Visit: Payer: Self-pay | Admitting: General Surgery

## 2020-06-10 DIAGNOSIS — I10 Essential (primary) hypertension: Secondary | ICD-10-CM | POA: Insufficient documentation

## 2020-06-10 DIAGNOSIS — Z01818 Encounter for other preprocedural examination: Secondary | ICD-10-CM | POA: Diagnosis present

## 2020-06-10 HISTORY — DX: Hyperlipidemia, unspecified: E78.5

## 2020-06-10 HISTORY — DX: Type 2 diabetes mellitus without complications: E11.9

## 2020-06-10 HISTORY — DX: Essential (primary) hypertension: I10

## 2020-06-10 HISTORY — DX: Anemia, unspecified: D64.9

## 2020-06-10 HISTORY — DX: Gastro-esophageal reflux disease without esophagitis: K21.9

## 2020-06-10 HISTORY — DX: Benign neoplasm of connective and other soft tissue, unspecified: D21.9

## 2020-06-10 LAB — BASIC METABOLIC PANEL
Anion gap: 14 (ref 5–15)
BUN: 15 mg/dL (ref 6–20)
CO2: 26 mmol/L (ref 22–32)
Calcium: 9.9 mg/dL (ref 8.9–10.3)
Chloride: 96 mmol/L — ABNORMAL LOW (ref 98–111)
Creatinine, Ser: 0.8 mg/dL (ref 0.44–1.00)
GFR calc Af Amer: >60 mL/min (ref >60)
GFR calc non Af Amer: >60 mL/min (ref >60)
Glucose, Bld: 141 mg/dL — ABNORMAL HIGH (ref 70–99)
Potassium: 4.1 mmol/L (ref 3.5–5.1)
Sodium: 136 mmol/L (ref 135–145)

## 2020-06-10 LAB — CBC
HCT: 36.3 % (ref 36.0–46.0)
Hemoglobin: 12.4 g/dL (ref 12.0–15.0)
MCH: 30.2 pg (ref 26.0–34.0)
MCHC: 34.2 g/dL (ref 30.0–36.0)
MCV: 88.3 fL (ref 80.0–100.0)
Platelets: 217 10*3/uL (ref 150–400)
RBC: 4.11 MIL/uL (ref 3.87–5.11)
RDW: 12.4 % (ref 11.5–15.5)
WBC: 4.9 10*3/uL (ref 4.0–10.5)
nRBC: 0 % (ref 0.0–0.2)

## 2020-06-10 LAB — HEMOGLOBIN A1C
Hgb A1c MFr Bld: 6.8 % — ABNORMAL HIGH (ref 4.8–5.6)
Mean Plasma Glucose: 148.46 mg/dL

## 2020-06-10 LAB — GLUCOSE, CAPILLARY: Glucose-Capillary: 138 mg/dL — ABNORMAL HIGH (ref 70–99)

## 2020-06-12 ENCOUNTER — Other Ambulatory Visit (HOSPITAL_COMMUNITY)
Admission: RE | Admit: 2020-06-12 | Discharge: 2020-06-12 | Disposition: A | Discharge status: Home / Self Care | Payer: No Typology Code available for payment source | Source: Ambulatory Visit | Attending: Obstetrics and Gynecology | Admitting: Obstetrics and Gynecology

## 2020-06-12 DIAGNOSIS — Z01812 Encounter for preprocedural laboratory examination: Secondary | ICD-10-CM | POA: Diagnosis present

## 2020-06-12 DIAGNOSIS — Z20822 Contact with and (suspected) exposure to covid-19: Secondary | ICD-10-CM | POA: Diagnosis not present

## 2020-06-12 LAB — SARS CORONAVIRUS 2 (TAT 6-24 HRS): SARS Coronavirus 2: NEGATIVE

## 2020-06-14 NOTE — H&P (Signed)
54 y.o.  G1P0 with an enlarged symptomatic fibroid uterus.  Pt has has problems with irregular bleeding since 2017 and interestingly enough, also has prolapse of the lower part of the uterus and cervix down to nearly her hymen.  She was scheduled for robotic TLH/bilateral salpingectomies last year at this time with a repair of umbilical hernia by general surgeon; however she was diagnosed with diabetes mellitus with a A1C of 13 just preop.  Surgery was cancelled and she has worked with an endocrinologist and her last A1C recently was 6.8.    Pt had an [place] last year that showed a 13x10x10 cm uterus with clusters of multiple fibroids.  EM was obscured.  Ovaries were not seen but no ff.  Adenexa appeared normal.   EMB was done and was benign.  Pt does not have any more bleeding but still has symptoms of prolapse and she feels that the uterus may have gotten bigger over the last year.  I did not elect to repeat US this year as EUA will help determine if pt needs a supraumbilical incision for the camera port.  We are both prepared for a manual morcellation of uterus that may take a prolonged time.    Past Medical History:  Diagnosis Date  . Anemia   . Diabetes mellitus without complication ([place])   . Fibroids   . GERD (gastroesophageal reflux disease)   . Hyperlipidemia   . Hypertension    Past Surgical History:  Procedure Laterality Date  . MYOMECTOMY    . WISDOM TOOTH EXTRACTION      Social History   Socioeconomic History  . Marital status: Legally Separated    Spouse name: Not on file  . Number of children: Not on file  . Years of education: Not on file  . Highest education level: Not on file  Occupational History  . Not on file  Tobacco Use  . Smoking status: Former Smoker    Packs/day: 1.00    Types: Cigarettes  . Smokeless tobacco: Never Used  . Tobacco comment: Quit 5 years ago  Vaping Use  . Vaping Use: Every day  Substance and Sexual Activity  . Alcohol use: Yes     Alcohol/week: 1.0 standard drink    Types: 1 Glasses of wine per week    Comment: 1 glass of wine daily  . Drug use: Never  . Sexual activity: Not on file  Other Topics Concern  . Not on file  Social History Narrative  . Not on file   Social Determinants of Health   Financial Resource Strain:   . Difficulty of Paying Living Expenses: Not on file  Food Insecurity:   . Worried About Charity fundraiser in the Last Year: Not on file  . Ran Out of Food in the Last Year: Not on file  Transportation Needs:   . Lack of Transportation (Medical): Not on file  . Lack of Transportation (Non-Medical): Not on file  Physical Activity:   . Days of Exercise per Week: Not on file  . Minutes of Exercise per Session: Not on file  Stress:   . Feeling of Stress : Not on file  Social Connections:   . Frequency of Communication with Friends and Family: Not on file  . Frequency of Social Gatherings with Friends and Family: Not on file  . Attends Religious Services: Not on file  . Active Member of Clubs or Organizations: Not on file  . Attends Archivist Meetings:  Not on file  . Marital Status: Not on file  Intimate Partner Violence:   . Fear of Current or Ex-Partner: Not on file  . Emotionally Abused: Not on file  . Physically Abused: Not on file  . Sexually Abused: Not on file    No current facility-administered medications on file prior to encounter.   Current Outpatient Medications on File Prior to Encounter  Medication Sig Dispense Refill  . aspirin EC 81 MG tablet Take 81 mg by mouth daily. Swallow whole.    . ibuprofen (ADVIL,MOTRIN) 200 MG tablet Take 200 mg by mouth every 6 (six) hours as needed for moderate pain.    Marland Kitchen JARDIANCE 25 MG TABS tablet Take 25 mg by mouth daily.    Marland Kitchen losartan-hydrochlorothiazide (HYZAAR) 100-25 MG tablet Take 1 tablet by mouth daily.    . metFORMIN (GLUCOPHAGE) 1000 MG tablet Take 1,000 mg by mouth 2 (two) times daily.    . simvastatin (ZOCOR) 10  MG tablet Take 10 mg by mouth at bedtime.      Allergies  Allergen Reactions  . Amoxicillin-Pot Clavulanate Diarrhea    C-diff  . Ace Inhibitors Other (See Comments) and Cough    Cough    . Amlodipine Besy-Benazepril Hcl Other (See Comments)    cough      There were no vitals filed for this visit.  Lungs: clear to ascultation Cor:  RRR Abdomen:  soft, nontender, nondistended. Ex:  no cords, erythema Pelvic:   Abdomen .   Auscultation/Inspection/Palpation: soft, non-distended, no tenderness, no hepatomegaly, no splenomegaly, no masses .   Hernia: incisional (above umbilicus- fat only in, unable to feel edges, may be about 3 cm.)  Breast .   Inspection/Palpation: no skin changes, no abnormal secretions, nipple appearance normal, no tenderness, no distinct masses  Female Genitalia .   Vulva: no masses, no atrophy, no lesions .   Vagina: no tenderness, no erythema, no abnormal vaginal discharge, no vesicle(s) or ulcers, no cystocele, no rectocele .   Cervix: grossly normal, no discharge, no cervical motion tenderness .   Uterus: normal shape, midline, non-tender, enlarged, uterine prolapse (cervix at hymen. equal anterior and posterior. no real cystocele, no rectocele and urethra is well supported. Cervix is long.) .   Bladder/Urethra: normal meatus, no urethral discharge, no urethral mass, bladder non distended, Urethra well supported .   Adnexa/Parametria: no parametrial tenderness, no parametrial mass, no adnexal tenderness, no ovarian mass   A:  For robotic TLH/salpingectomies for symptomatic fibroid uterus.  Pt is not candidate for vaginal hysterectomy secondary large size of uterus.  Pt also to have incisional umbilical hernia repaired by Dr. Rosendo Gros post robotic hysterectomy procedure.   P: P: All risks, benefits and alternatives d/w patient and she desires to proceed.  Patient has undergone a modified diet, ERAS protocol and will receive preop antibiotics and SCDs  during the operation.   Pt to have extended recovery but will go home same day if eating, ambulating, voiding and pain control is good.  Patricia Mckee

## 2020-06-16 ENCOUNTER — Other Ambulatory Visit: Payer: Self-pay

## 2020-06-16 ENCOUNTER — Ambulatory Visit (HOSPITAL_BASED_OUTPATIENT_CLINIC_OR_DEPARTMENT_OTHER): Payer: No Typology Code available for payment source | Admitting: Anesthesiology

## 2020-06-16 ENCOUNTER — Encounter (HOSPITAL_BASED_OUTPATIENT_CLINIC_OR_DEPARTMENT_OTHER)
Admission: RE | Disposition: A | Discharge status: Home / Self Care | Payer: Self-pay | Source: Home / Self Care | Attending: Obstetrics and Gynecology

## 2020-06-16 ENCOUNTER — Ambulatory Visit (HOSPITAL_BASED_OUTPATIENT_CLINIC_OR_DEPARTMENT_OTHER)
Admission: RE | Admit: 2020-06-16 | Discharge: 2020-06-16 | Disposition: A | Discharge status: Home / Self Care | Payer: No Typology Code available for payment source | Attending: Obstetrics and Gynecology | Admitting: Obstetrics and Gynecology

## 2020-06-16 ENCOUNTER — Encounter (HOSPITAL_BASED_OUTPATIENT_CLINIC_OR_DEPARTMENT_OTHER): Payer: Self-pay | Admitting: Obstetrics and Gynecology

## 2020-06-16 DIAGNOSIS — K432 Incisional hernia without obstruction or gangrene: Secondary | ICD-10-CM | POA: Insufficient documentation

## 2020-06-16 DIAGNOSIS — Z7984 Long term (current) use of oral hypoglycemic drugs: Secondary | ICD-10-CM | POA: Insufficient documentation

## 2020-06-16 DIAGNOSIS — E785 Hyperlipidemia, unspecified: Secondary | ICD-10-CM | POA: Insufficient documentation

## 2020-06-16 DIAGNOSIS — Z7982 Long term (current) use of aspirin: Secondary | ICD-10-CM | POA: Diagnosis not present

## 2020-06-16 DIAGNOSIS — Z880 Allergy status to penicillin: Secondary | ICD-10-CM | POA: Diagnosis not present

## 2020-06-16 DIAGNOSIS — E119 Type 2 diabetes mellitus without complications: Secondary | ICD-10-CM | POA: Diagnosis not present

## 2020-06-16 DIAGNOSIS — N800 Endometriosis of uterus: Secondary | ICD-10-CM | POA: Insufficient documentation

## 2020-06-16 DIAGNOSIS — I10 Essential (primary) hypertension: Secondary | ICD-10-CM | POA: Diagnosis not present

## 2020-06-16 DIAGNOSIS — Z888 Allergy status to other drugs, medicaments and biological substances status: Secondary | ICD-10-CM | POA: Diagnosis not present

## 2020-06-16 DIAGNOSIS — N840 Polyp of corpus uteri: Secondary | ICD-10-CM | POA: Diagnosis not present

## 2020-06-16 DIAGNOSIS — Z9889 Other specified postprocedural states: Secondary | ICD-10-CM

## 2020-06-16 DIAGNOSIS — D259 Leiomyoma of uterus, unspecified: Secondary | ICD-10-CM | POA: Insufficient documentation

## 2020-06-16 DIAGNOSIS — Z87891 Personal history of nicotine dependence: Secondary | ICD-10-CM | POA: Insufficient documentation

## 2020-06-16 DIAGNOSIS — Z79899 Other long term (current) drug therapy: Secondary | ICD-10-CM | POA: Insufficient documentation

## 2020-06-16 HISTORY — PX: ROBOTIC ASSISTED LAPAROSCOPIC HYSTERECTOMY AND SALPINGECTOMY: SHX6379

## 2020-06-16 HISTORY — PX: UMBILICAL HERNIA REPAIR: SHX196

## 2020-06-16 LAB — TYPE AND SCREEN
ABO/RH(D): B POS
Antibody Screen: NEGATIVE

## 2020-06-16 LAB — GLUCOSE, CAPILLARY
Glucose-Capillary: 120 mg/dL — ABNORMAL HIGH (ref 70–99)
Glucose-Capillary: 188 mg/dL — ABNORMAL HIGH (ref 70–99)
Glucose-Capillary: 175 mg/dL — ABNORMAL HIGH (ref 70–99)

## 2020-06-16 LAB — ABO/RH: ABO/RH(D): B POS

## 2020-06-16 SURGERY — XI ROBOTIC ASSISTED LAPAROSCOPIC HYSTERECTOMY AND SALPINGECTOMY
Anesthesia: General

## 2020-06-16 MED ORDER — ACETAMINOPHEN 500 MG PO TABS
1000.0000 mg | ORAL_TABLET | ORAL | Status: AC
  Administered 2020-06-16: 1000 mg via ORAL

## 2020-06-16 MED ORDER — SOD CITRATE-CITRIC ACID 500-334 MG/5ML PO SOLN: 30.0000 mL | ORAL | Status: DC

## 2020-06-16 MED ORDER — INDIGOTINDISULFONATE SODIUM 8 MG/ML IJ SOLN
INTRAMUSCULAR | Status: DC | PRN
  Administered 2020-06-16: 5 mL via INTRAVENOUS

## 2020-06-16 MED ORDER — ONDANSETRON HCL 4 MG/2ML IJ SOLN
INTRAMUSCULAR | Status: AC
  Filled 2020-06-16: qty 2

## 2020-06-16 MED ORDER — ACETAMINOPHEN 500 MG PO TABS: 1000.0000 mg | ORAL_TABLET | Freq: Once | ORAL | Status: DC

## 2020-06-16 MED ORDER — HYDROMORPHONE HCL 1 MG/ML IJ SOLN
0.2500 mg | INTRAMUSCULAR | Status: DC | PRN
  Administered 2020-06-16: 0.25 mg via INTRAVENOUS

## 2020-06-16 MED ORDER — 0.9 % SODIUM CHLORIDE (POUR BTL) OPTIME
TOPICAL | Status: DC | PRN
  Administered 2020-06-16: 1000 mL

## 2020-06-16 MED ORDER — METFORMIN HCL 500 MG PO TABS: 1000.0000 mg | ORAL_TABLET | Freq: Two times a day (BID) | ORAL | Status: DC

## 2020-06-16 MED ORDER — CEFAZOLIN SODIUM-DEXTROSE 2-4 GM/100ML-% IV SOLN
INTRAVENOUS | Status: AC
  Filled 2020-06-16: qty 100

## 2020-06-16 MED ORDER — SCOPOLAMINE 1 MG/3DAYS TD PT72
MEDICATED_PATCH | TRANSDERMAL | Status: AC
  Filled 2020-06-16: qty 1

## 2020-06-16 MED ORDER — GABAPENTIN 300 MG PO CAPS
300.0000 mg | ORAL_CAPSULE | ORAL | Status: AC
  Administered 2020-06-16: 300 mg via ORAL

## 2020-06-16 MED ORDER — LIDOCAINE 2% (20 MG/ML) 5 ML SYRINGE
INTRAMUSCULAR | Status: DC | PRN
  Administered 2020-06-16: 20 mg via INTRAVENOUS

## 2020-06-16 MED ORDER — SODIUM CHLORIDE 0.9 % IV SOLN
INTRAVENOUS | Status: DC | PRN
  Administered 2020-06-16: 60 mL

## 2020-06-16 MED ORDER — CELECOXIB 200 MG PO CAPS
400.0000 mg | ORAL_CAPSULE | ORAL | Status: AC
  Administered 2020-06-16: 400 mg via ORAL

## 2020-06-16 MED ORDER — MIDAZOLAM HCL 5 MG/5ML IJ SOLN
INTRAMUSCULAR | Status: DC | PRN
  Administered 2020-06-16: 2 mg via INTRAVENOUS

## 2020-06-16 MED ORDER — PROPOFOL 10 MG/ML IV BOLUS
INTRAVENOUS | Status: AC
  Filled 2020-06-16: qty 20

## 2020-06-16 MED ORDER — KETOROLAC TROMETHAMINE 30 MG/ML IJ SOLN
INTRAMUSCULAR | Status: DC | PRN
  Administered 2020-06-16: 30 mg via INTRAVENOUS

## 2020-06-16 MED ORDER — KETAMINE HCL 10 MG/ML IJ SOLN
INTRAMUSCULAR | Status: AC
  Filled 2020-06-16: qty 1

## 2020-06-16 MED ORDER — PROMETHAZINE HCL 25 MG/ML IJ SOLN: 6.2500 mg | INTRAMUSCULAR | Status: DC | PRN

## 2020-06-16 MED ORDER — HYDROMORPHONE HCL 1 MG/ML IJ SOLN
INTRAMUSCULAR | Status: AC
  Filled 2020-06-16: qty 1

## 2020-06-16 MED ORDER — OXYCODONE HCL 5 MG/5ML PO SOLN: 5.0000 mg | Freq: Once | ORAL | Status: DC | PRN

## 2020-06-16 MED ORDER — LIDOCAINE 2% (20 MG/ML) 5 ML SYRINGE
INTRAMUSCULAR | Status: AC
  Filled 2020-06-16: qty 5

## 2020-06-16 MED ORDER — LIDOCAINE HCL 2 % IJ SOLN
INTRAMUSCULAR | Status: AC
  Filled 2020-06-16: qty 20

## 2020-06-16 MED ORDER — LACTATED RINGERS IV SOLN: INTRAVENOUS | Status: DC

## 2020-06-16 MED ORDER — HYDROMORPHONE HCL 1 MG/ML IJ SOLN
INTRAMUSCULAR | Status: AC
Start: 2020-06-16 — End: ?
  Filled 2020-06-16: qty 1

## 2020-06-16 MED ORDER — ACETAMINOPHEN 500 MG PO TABS
ORAL_TABLET | ORAL | Status: AC
  Filled 2020-06-16: qty 2

## 2020-06-16 MED ORDER — SUGAMMADEX SODIUM 200 MG/2ML IV SOLN
INTRAVENOUS | Status: DC | PRN
  Administered 2020-06-16: 180 mg via INTRAVENOUS

## 2020-06-16 MED ORDER — IBUPROFEN 800 MG PO TABS: 800.0000 mg | ORAL_TABLET | Freq: Three times a day (TID) | ORAL | Status: DC

## 2020-06-16 MED ORDER — SIMVASTATIN 10 MG PO TABS: 10.0000 mg | ORAL_TABLET | Freq: Every day | ORAL | Status: DC

## 2020-06-16 MED ORDER — CHLORHEXIDINE GLUCONATE CLOTH 2 % EX PADS: 6.0000 | MEDICATED_PAD | Freq: Once | CUTANEOUS | Status: DC

## 2020-06-16 MED ORDER — PROPOFOL 10 MG/ML IV BOLUS
INTRAVENOUS | Status: DC | PRN
  Administered 2020-06-16: 200 mg via INTRAVENOUS

## 2020-06-16 MED ORDER — FENTANYL CITRATE (PF) 100 MCG/2ML IJ SOLN
INTRAMUSCULAR | Status: AC
  Filled 2020-06-16: qty 2

## 2020-06-16 MED ORDER — ROCURONIUM BROMIDE 10 MG/ML (PF) SYRINGE
PREFILLED_SYRINGE | INTRAVENOUS | Status: AC
  Filled 2020-06-16: qty 10

## 2020-06-16 MED ORDER — FENTANYL CITRATE (PF) 250 MCG/5ML IJ SOLN
INTRAMUSCULAR | Status: AC
Start: 2020-06-16 — End: ?
  Filled 2020-06-16: qty 5

## 2020-06-16 MED ORDER — VANCOMYCIN HCL IN DEXTROSE 1-5 GM/200ML-% IV SOLN
INTRAVENOUS | Status: AC
  Filled 2020-06-16: qty 200

## 2020-06-16 MED ORDER — SODIUM CHLORIDE 0.9 % IR SOLN
Status: DC | PRN
  Administered 2020-06-16: 3000 mL

## 2020-06-16 MED ORDER — FENTANYL CITRATE (PF) 100 MCG/2ML IJ SOLN
INTRAMUSCULAR | Status: DC | PRN
  Administered 2020-06-16 (×3): 50 ug via INTRAVENOUS
  Administered 2020-06-16: 100 ug via INTRAVENOUS
  Administered 2020-06-16: 50 ug via INTRAVENOUS

## 2020-06-16 MED ORDER — SCOPOLAMINE 1 MG/3DAYS TD PT72
1.0000 | MEDICATED_PATCH | TRANSDERMAL | Status: DC
  Administered 2020-06-16: 1.5 mg via TRANSDERMAL

## 2020-06-16 MED ORDER — MENTHOL 3 MG MT LOZG: 1.0000 | LOZENGE | OROMUCOSAL | Status: DC | PRN

## 2020-06-16 MED ORDER — MEPERIDINE HCL 25 MG/ML IJ SOLN: 6.2500 mg | INTRAMUSCULAR | Status: DC | PRN

## 2020-06-16 MED ORDER — GABAPENTIN 300 MG PO CAPS
ORAL_CAPSULE | ORAL | Status: AC
  Filled 2020-06-16: qty 1

## 2020-06-16 MED ORDER — MIDAZOLAM HCL 2 MG/2ML IJ SOLN
INTRAMUSCULAR | Status: AC
  Filled 2020-06-16: qty 2

## 2020-06-16 MED ORDER — OXYCODONE-ACETAMINOPHEN 5-325 MG PO TABS
1.0000 | ORAL_TABLET | ORAL | 0 refills | Status: AC | PRN
Start: 2020-06-16 — End: ?

## 2020-06-16 MED ORDER — DOCUSATE SODIUM 100 MG PO CAPS
ORAL_CAPSULE | ORAL | Status: AC
  Filled 2020-06-16: qty 1

## 2020-06-16 MED ORDER — KETOROLAC TROMETHAMINE 30 MG/ML IJ SOLN: 30.0000 mg | Freq: Four times a day (QID) | INTRAMUSCULAR | Status: DC

## 2020-06-16 MED ORDER — DEXAMETHASONE SODIUM PHOSPHATE 10 MG/ML IJ SOLN
INTRAMUSCULAR | Status: DC | PRN
  Administered 2020-06-16: 10 mg via INTRAVENOUS

## 2020-06-16 MED ORDER — BUPIVACAINE HCL 0.25 % IJ SOLN
INTRAMUSCULAR | Status: DC | PRN
  Administered 2020-06-16: 3 mL

## 2020-06-16 MED ORDER — OXYCODONE-ACETAMINOPHEN 5-325 MG PO TABS
ORAL_TABLET | ORAL | Status: AC
  Filled 2020-06-16: qty 2

## 2020-06-16 MED ORDER — ONDANSETRON HCL 4 MG/2ML IJ SOLN: 4.0000 mg | Freq: Four times a day (QID) | INTRAMUSCULAR | Status: DC | PRN

## 2020-06-16 MED ORDER — LIDOCAINE HCL (PF) 2 % IJ SOLN
INTRAMUSCULAR | Status: DC | PRN
  Administered 2020-06-16: 1.076 mg/kg/h via INTRADERMAL

## 2020-06-16 MED ORDER — EMPAGLIFLOZIN 25 MG PO TABS: 25.0000 mg | ORAL_TABLET | Freq: Every day | ORAL | Status: DC

## 2020-06-16 MED ORDER — DOCUSATE SODIUM 100 MG PO CAPS
100.0000 mg | ORAL_CAPSULE | Freq: Two times a day (BID) | ORAL | Status: DC
  Administered 2020-06-16: 100 mg via ORAL

## 2020-06-16 MED ORDER — MIDAZOLAM HCL 2 MG/2ML IJ SOLN: 0.5000 mg | Freq: Once | INTRAMUSCULAR | Status: DC | PRN

## 2020-06-16 MED ORDER — DEXAMETHASONE SODIUM PHOSPHATE 10 MG/ML IJ SOLN
INTRAMUSCULAR | Status: AC
  Filled 2020-06-16: qty 1

## 2020-06-16 MED ORDER — OXYCODONE-ACETAMINOPHEN 5-325 MG PO TABS
1.0000 | ORAL_TABLET | ORAL | Status: DC | PRN
  Administered 2020-06-16: 2 via ORAL

## 2020-06-16 MED ORDER — CEFAZOLIN SODIUM-DEXTROSE 2-4 GM/100ML-% IV SOLN
2.0000 g | INTRAVENOUS | Status: AC
  Administered 2020-06-16: 2 g via INTRAVENOUS

## 2020-06-16 MED ORDER — ROCURONIUM BROMIDE 10 MG/ML (PF) SYRINGE
PREFILLED_SYRINGE | INTRAVENOUS | Status: DC | PRN
  Administered 2020-06-16: 20 mg via INTRAVENOUS
  Administered 2020-06-16: 60 mg via INTRAVENOUS
  Administered 2020-06-16: 10 mg via INTRAVENOUS
  Administered 2020-06-16: 20 mg via INTRAVENOUS

## 2020-06-16 MED ORDER — POVIDONE-IODINE 10 % EX SWAB: 2.0000 "application " | Freq: Once | CUTANEOUS | Status: DC

## 2020-06-16 MED ORDER — VANCOMYCIN HCL IN DEXTROSE 1-5 GM/200ML-% IV SOLN: 1000.0000 mg | INTRAVENOUS | Status: DC

## 2020-06-16 MED ORDER — KETOROLAC TROMETHAMINE 15 MG/ML IJ SOLN: 15.0000 mg | INTRAMUSCULAR | Status: DC

## 2020-06-16 MED ORDER — KETAMINE HCL 10 MG/ML IJ SOLN
INTRAMUSCULAR | Status: DC | PRN
  Administered 2020-06-16: 30 mg via INTRAVENOUS

## 2020-06-16 MED ORDER — ONDANSETRON HCL 4 MG/2ML IJ SOLN
INTRAMUSCULAR | Status: DC | PRN
  Administered 2020-06-16: 4 mg via INTRAVENOUS

## 2020-06-16 MED ORDER — CELECOXIB 200 MG PO CAPS
ORAL_CAPSULE | ORAL | Status: AC
  Filled 2020-06-16: qty 2

## 2020-06-16 MED ORDER — ONDANSETRON HCL 4 MG PO TABS: 4.0000 mg | ORAL_TABLET | Freq: Four times a day (QID) | ORAL | Status: DC | PRN

## 2020-06-16 MED ORDER — KETOROLAC TROMETHAMINE 30 MG/ML IJ SOLN
INTRAMUSCULAR | Status: AC
  Filled 2020-06-16: qty 1

## 2020-06-16 MED ORDER — OXYCODONE HCL 5 MG PO TABS: 5.0000 mg | ORAL_TABLET | Freq: Once | ORAL | Status: DC | PRN

## 2020-06-16 MED ORDER — LOSARTAN POTASSIUM-HCTZ 100-25 MG PO TABS: 1.0000 | ORAL_TABLET | Freq: Every day | ORAL | Status: DC

## 2020-06-16 SURGICAL SUPPLY — 91 items
ADH SKN CLS APL DERMABOND .7 (GAUZE/BANDAGES/DRESSINGS) ×2
APL PRP STRL LF DISP 70% ISPRP (MISCELLANEOUS) ×2
APPLIER CLIP LOGIC TI 5 (MISCELLANEOUS) IMPLANT
APPLIER CLIP ROT 10 11.4 M/L (STAPLE)
APR CLP MED LRG 11.4X10 (STAPLE)
APR CLP MED LRG 33X5 (MISCELLANEOUS)
BARRIER ADHS 3X4 INTERCEED (GAUZE/BANDAGES/DRESSINGS) IMPLANT
BINDER ABDOMINAL 12 SM 30-45 (SOFTGOODS) IMPLANT
BLADE CLIPPER SENSICLIP SURGIC (BLADE) IMPLANT
BRR ADH 4X3 ABS CNTRL BYND (GAUZE/BANDAGES/DRESSINGS)
CANISTER SUCT 3000ML PPV (MISCELLANEOUS) ×2 IMPLANT
CHLORAPREP W/TINT 26 (MISCELLANEOUS) ×3 IMPLANT
CLIP APPLIE ROT 10 11.4 M/L (STAPLE) IMPLANT
COVER BACK TABLE 60X90IN (DRAPES) ×3 IMPLANT
COVER TIP SHEARS 8 DVNC (MISCELLANEOUS) ×2 IMPLANT
COVER TIP SHEARS 8MM DA VINCI (MISCELLANEOUS) ×1
COVER WAND RF STERILE (DRAPES) ×3 IMPLANT
DECANTER SPIKE VIAL GLASS SM (MISCELLANEOUS) ×3 IMPLANT
DEFOGGER SCOPE WARMER CLEARIFY (MISCELLANEOUS) ×3 IMPLANT
DERMABOND ADVANCED (GAUZE/BANDAGES/DRESSINGS) ×1
DERMABOND ADVANCED .7 DNX12 (GAUZE/BANDAGES/DRESSINGS) ×4 IMPLANT
DEVICE SECURE STRAP 25 ABSORB (INSTRUMENTS) ×3 IMPLANT
DEVICE TROCAR PUNCTURE CLOSURE (ENDOMECHANICALS) ×2 IMPLANT
DRAPE ARM DVNC X/XI (DISPOSABLE) ×8 IMPLANT
DRAPE COLUMN DVNC XI (DISPOSABLE) ×2 IMPLANT
DRAPE DA VINCI XI ARM (DISPOSABLE) ×4
DRAPE DA VINCI XI COLUMN (DISPOSABLE) ×1
DRAPE UTILITY XL STRL (DRAPES) ×6 IMPLANT
DURAPREP 26ML APPLICATOR (WOUND CARE) ×3 IMPLANT
ELECT REM PT RETURN 9FT ADLT (ELECTROSURGICAL) ×6
ELECTRODE REM PT RTRN 9FT ADLT (ELECTROSURGICAL) ×4 IMPLANT
GLOVE BIO SURGEON STRL SZ 6 (GLOVE) ×2 IMPLANT
GLOVE BIO SURGEON STRL SZ7 (GLOVE) ×12 IMPLANT
GLOVE BIO SURGEON STRL SZ7.5 (GLOVE) ×3 IMPLANT
GLOVE BIOGEL PI IND STRL 6 (GLOVE) IMPLANT
GLOVE BIOGEL PI INDICATOR 6 (GLOVE) ×2
GLOVE INDICATOR 8.0 STRL GRN (GLOVE) ×3 IMPLANT
GOWN STRL REUS W/TWL LRG LVL3 (GOWN DISPOSABLE) ×3 IMPLANT
HOLDER FOLEY CATH W/STRAP (MISCELLANEOUS) IMPLANT
IRRIG SUCT STRYKERFLOW 2 WTIP (MISCELLANEOUS) ×3
IRRIGATION SUCT STRKRFLW 2 WTP (MISCELLANEOUS) ×2 IMPLANT
KIT TURNOVER CYSTO (KITS) ×3 IMPLANT
LEGGING LITHOTOMY PAIR STRL (DRAPES) ×3 IMPLANT
MANIPULATOR ADVINCU DEL 2.5 PL (MISCELLANEOUS) ×1 IMPLANT
MANIPULATOR ADVINCU DEL 3.0 PL (MISCELLANEOUS) IMPLANT
MANIPULATOR ADVINCU DEL 3.5 PL (MISCELLANEOUS) IMPLANT
MANIPULATOR ADVINCU DEL 4.0 PL (MISCELLANEOUS) IMPLANT
MESH VENTRALIGHT ST 4X6IN (Mesh General) ×1 IMPLANT
NDL SPNL 22GX3.5 QUINCKE BK (NEEDLE) IMPLANT
NEEDLE INSUFFLATION 120MM (ENDOMECHANICALS) ×6 IMPLANT
NEEDLE SPNL 22GX3.5 QUINCKE BK (NEEDLE) IMPLANT
NS IRRIG 1000ML POUR BTL (IV SOLUTION) ×3 IMPLANT
OBTURATOR OPTICAL STANDARD 8MM (TROCAR) ×1
OBTURATOR OPTICAL STND 8 DVNC (TROCAR) ×2
OBTURATOR OPTICALSTD 8 DVNC (TROCAR) ×2 IMPLANT
PACK BASIN DAY SURGERY FS (CUSTOM PROCEDURE TRAY) ×2 IMPLANT
PACK ROBOT WH (CUSTOM PROCEDURE TRAY) ×3 IMPLANT
PACK ROBOTIC GOWN (GOWN DISPOSABLE) ×3 IMPLANT
PACK TRENDGUARD 450 HYBRID PRO (MISCELLANEOUS) IMPLANT
PAD ARMBOARD 7.5X6 YLW CONV (MISCELLANEOUS) ×6 IMPLANT
PAD OB MATERNITY 4.3X12.25 (PERSONAL CARE ITEMS) ×3 IMPLANT
PAD PREP 24X48 CUFFED NSTRL (MISCELLANEOUS) ×3 IMPLANT
POUCH LAPAROSCOPIC INSTRUMENT (MISCELLANEOUS) ×1 IMPLANT
PROTECTOR NERVE ULNAR (MISCELLANEOUS) ×6 IMPLANT
SCISSORS LAP 5X35 DISP (ENDOMECHANICALS) ×3 IMPLANT
SEAL CANN UNIV 5-8 DVNC XI (MISCELLANEOUS) ×6 IMPLANT
SEAL XI 5MM-8MM UNIVERSAL (MISCELLANEOUS) ×3
SET IRRIG Y TYPE TUR BLADDER L (SET/KITS/TRAYS/PACK) ×3 IMPLANT
SET SUCTION IRRIG HYDROSURG (IRRIGATION / IRRIGATOR) IMPLANT
SET TRI-LUMEN FLTR TB AIRSEAL (TUBING) ×3 IMPLANT
SET TUBE SMOKE EVAC HIGH FLOW (TUBING) ×3 IMPLANT
SUT CHROMIC 2 0 SH (SUTURE) ×3 IMPLANT
SUT ETHIBOND 0 MO6 C/R (SUTURE) IMPLANT
SUT MNCRL AB 4-0 PS2 18 (SUTURE) ×3 IMPLANT
SUT NOVA 1 T20/GS 25DT (SUTURE) ×1 IMPLANT
SUT PROLENE 2 0 KS (SUTURE) IMPLANT
SUT VIC AB 0 CT1 36 (SUTURE) ×4 IMPLANT
SUT VICRYL RAPIDE 3 0 (SUTURE) ×7 IMPLANT
SUT VLOC 180 0 9IN  GS21 (SUTURE) ×1
SUT VLOC 180 0 9IN GS21 (SUTURE) ×2 IMPLANT
TOWEL OR 17X26 10 PK STRL BLUE (TOWEL DISPOSABLE) ×3 IMPLANT
TRAY FOL W/BAG SLVR 16FR STRL (SET/KITS/TRAYS/PACK) IMPLANT
TRAY FOLEY W/BAG SLVR 14FR LF (SET/KITS/TRAYS/PACK) ×3 IMPLANT
TRAY FOLEY W/BAG SLVR 16FR LF (SET/KITS/TRAYS/PACK) ×3
TRAY LAPAROSCOPIC (CUSTOM PROCEDURE TRAY) ×2 IMPLANT
TRENDGUARD 450 HYBRID PRO PACK (MISCELLANEOUS) ×3
TROCAR BLADELESS OPT 5 100 (ENDOMECHANICALS) ×5 IMPLANT
TROCAR PORT AIRSEAL 5X120 (TROCAR) ×3 IMPLANT
TROCAR XCEL BLUNT TIP 100MML (ENDOMECHANICALS) IMPLANT
TROCAR XCEL NON-BLD 11X100MML (ENDOMECHANICALS) IMPLANT
WATER STERILE IRR 1000ML POUR (IV SOLUTION) ×6 IMPLANT

## 2020-06-16 NOTE — Anesthesia Procedure Notes (Signed)
Procedure Name: Intubation Date/Time: 06/16/2020 9:27 AM Performed by: Bonney Aid, CRNA Pre-anesthesia Checklist: Patient identified, Emergency Drugs available, Suction available and Patient being monitored Patient Re-evaluated:Patient Re-evaluated prior to induction Oxygen Delivery Method: Circle system utilized Preoxygenation: Pre-oxygenation with 100% oxygen Induction Type: IV induction Ventilation: Mask ventilation without difficulty Laryngoscope Size: Mac Grade View: Grade I Tube type: Oral Tube size: 7.0 mm Number of attempts: 1 Airway Equipment and Method: Stylet Placement Confirmation: ETT inserted through vocal cords under direct vision,  positive ETCO2 and breath sounds checked- equal and bilateral Secured at: 21 cm Tube secured with: Tape Dental Injury: Teeth and Oropharynx as per pre-operative assessment

## 2020-06-16 NOTE — Transfer of Care (Signed)
Immediate Anesthesia Transfer of Care Note  Patient: Patricia Mckee  Procedure(s) Performed: XI ROBOTIC ASSISTED LAPAROSCOPIC HYSTERECTOMY AND SALPINGECTOMY (Bilateral ) LAPAROSCOPIC UMBILICAL HERNIA REPAIR  WITH MESH (N/A )  Patient Location: PACU  Anesthesia Type:General  Level of Consciousness: sedated  Airway & Oxygen Therapy: Patient Spontanous Breathing and Patient connected to face mask oxygen  Post-op Assessment: Report given to RN  Post vital signs: Reviewed and stable  Last Vitals:  Vitals Value Taken Time  BP 125/88 06/16/20 1301  Temp 36.7 C 06/16/20 1300  Pulse 90 06/16/20 1301  Resp 18 06/16/20 1303  SpO2 100 % 06/16/20 1301  Vitals shown include unvalidated device data.  Last Pain:  Vitals:   06/16/20 0802  TempSrc: Oral         Complications: No complications documented.

## 2020-06-16 NOTE — Anesthesia Preprocedure Evaluation (Addendum)
Anesthesia Evaluation  Patient identified by MRN, date of birth, ID band Patient awake    Reviewed: Allergy & Precautions, NPO status , Patient's Chart, lab work & pertinent test results  History of Anesthesia Complications Negative for: history of anesthetic complications  Airway Mallampati: II  TM Distance: >3 FB Neck ROM: Full    Dental  (+) Dental Advisory Given   Pulmonary Current Smoker (vapes)Patient did not abstain from smoking.,  06/12/2020 SARS coronavirus NEG   breath sounds clear to auscultation       Cardiovascular hypertension, Pt. on medications (-) angina Rhythm:Regular Rate:Normal     Neuro/Psych negative neurological ROS     GI/Hepatic Neg liver ROS, GERD  Controlled,  Endo/Other  diabetes (glu 120), Oral Hypoglycemic Agents  Renal/GU negative Renal ROS     Musculoskeletal   Abdominal   Peds  Hematology negative hematology ROS (+)   Anesthesia Other Findings   Reproductive/Obstetrics Post menopausal                            Anesthesia Physical Anesthesia Plan  ASA: II  Anesthesia Plan: General   Post-op Pain Management:    Induction: Intravenous  PONV Risk Score and Plan: 3 and Ondansetron, Dexamethasone and Scopolamine patch - Pre-op  Airway Management Planned: Oral ETT  Additional Equipment: None  Intra-op Plan:   Post-operative Plan: Extubation in OR  Informed Consent: I have reviewed the patients History and Physical, chart, labs and discussed the procedure including the risks, benefits and alternatives for the proposed anesthesia with the patient or authorized representative who has indicated his/her understanding and acceptance.     Dental advisory given  Plan Discussed with: CRNA and Surgeon  Anesthesia Plan Comments:        Anesthesia Quick Evaluation

## 2020-06-16 NOTE — Op Note (Signed)
06/16/2020  12:42 PM  PATIENT:  Patricia Mckee  53 y.o. female  PRE-OPERATIVE DIAGNOSIS:  FIBROIDS, incisional hernia  POST-OPERATIVE DIAGNOSIS:  FIBROIDS, incisional hernia  PROCEDURE:  Procedure(s):  LAPAROSCOPIC UMBILICAL HERNIA REPAIR  WITH MESH (N/A)  SURGEON:  Surgeon(s) and Role: Panel 2:    Ralene Ok, MD - Primary  ASSISTANTS: none   ANESTHESIA:   local and general  EBL:  50 mL   BLOOD ADMINISTERED:none  DRAINS: none   LOCAL MEDICATIONS USED:  BUPIVICAINE   SPECIMEN:  No Specimen  DISPOSITION OF SPECIMEN:  N/A  COUNTS:  YES  TOURNIQUET:  * No tourniquets in log *  DICTATION: .Dragon Dictation  Details of the procedure:   Dr. Philis Pique had her part of the case done.  She will dictate this under separate cover.    There was seen to be a non-incarcerated 1.5cm incisional hernia and umbilical weakness.     At this time the Falicform ligament was taken down with Bovie cautery maintaining hemostasis.   I proceeded to reduce the hernia contents.  The hernia sac was dissected out of the hernia and disposed.  The fascia at the hernia was reapproximated using a #1Novafil x 2.  Once the hernia was cleared away, a Bard Ventralight 15x10cm  mesh was inserted into the abdomen.  The mesh was secured circumferentially with am Securestrap tacker in a double crown fashion.    The omentum was brought over the area of the mesh. The trocar sites fascia was reapproximated with 0 vicryl The pneumoperitoneum was evacuated  & all trocars  were removed. The skin was reapproximated with 4-0  Monocryl sutures in a subcuticular fashion. The skin was dressed with Dermabond.  The patient was taken to the recovery room in stable condition.  Type of repair -primary suture & mesh  Mesh overlap - 5cm  Placement of mesh -  beneath fascia and into peritoneal cavity   PLAN OF CARE: Discharge to home after PACU  PATIENT DISPOSITION:  PACU - hemodynamically stable.   Delay  start of Pharmacological VTE agent (>24hrs) due to surgical blood loss or risk of bleeding: not applicable

## 2020-06-16 NOTE — Progress Notes (Addendum)
There has been no change in the patients history, status or exam since the history and physical.  Vitals:   06/16/20 0802  BP: (!) 146/85  Pulse: 95  Resp: 15  Temp: 98.7 F (37.1 C)  TempSrc: Oral  SpO2: 99%  Weight: 86.4 kg  Height: 5\' 7"  (1.702 m)    Results for orders placed or performed during the hospital encounter of 06/16/20 (from the past 72 hour(s))  Glucose, capillary     Status: Abnormal   Collection Time: 06/16/20  8:10 AM  Result Value Ref Range   Glucose-Capillary 120 (H) 70 - 99 mg/dL    Comment: Glucose reference range applies only to samples taken after fasting for at least 8 hours.   Comment 1 Repeat Test    Pt is abstinent from sex and cannot be pregnant.  Daria Pastures

## 2020-06-16 NOTE — H&P (Signed)
History of Present Illness  The patient is a 54 year old female who presents with an incisional hernia. Patient follow back up today secondary to incisional hernia. Patient states that secondary to That she put the partial hysterectomy and hernia repair. Patient states that she is ready to schedule surgery. Patient's had no change in the hernia. She has no signs or symptoms of incarceration or strangulation.  -------------------------------------------------------------- Referred by: Dr. Philis Pique Chief Complaint: Incisional hernia  Patient is a 54 year old female with a history of a robotic myomectomy approximately 10 years ago and was DC'd. Patient states that approximately 2 years after she did notice a small bulge at the suprapubic umbilical port site. Patient also developed further fibroids is to have a partial hysterectomy. Patient states that she has minimal discomfort from the hernia site. She states that she is able to reduce it manually. She is active and does do a lot of traveling with her work. She denies any signs or symptoms of incarceration or strangulation.  Patient Dr. Philis Pique plan on proceeding with robotic partial hysterectomy.    Allergies No Known Drug Allergies  [04/09/2018]: Allergies Reconciled   Medication History  Losartan Potassium-HCTZ (100-25MG  Tablet, Oral) Active. Medications Reconciled    Review of Systems  General Not Present- Appetite Loss, Chills, Fatigue, Fever, Night Sweats, Weight Gain and Weight Loss. Skin Not Present- Change in Wart/Mole, Dryness, Hives, Jaundice, New Lesions, Non-Healing Wounds, Rash and Ulcer. HEENT Present- Seasonal Allergies and Wears glasses/contact lenses. Not Present- Earache, Hearing Loss, Hoarseness, Nose Bleed, Oral Ulcers, Ringing in the Ears, Sinus Pain, Sore Throat, Visual Disturbances and Yellow Eyes. Respiratory Not Present- Bloody sputum, Chronic Cough, Difficulty Breathing, Snoring and  Wheezing. Breast Not Present- Breast Mass, Breast Pain, Nipple Discharge and Skin Changes. Cardiovascular Not Present- Chest Pain, Difficulty Breathing Lying Down, Leg Cramps, Palpitations, Rapid Heart Rate, Shortness of Breath and Swelling of Extremities. Gastrointestinal Present- Change in Bowel Habits. Not Present- Abdominal Pain, Bloating, Bloody Stool, Chronic diarrhea, Constipation, Difficulty Swallowing, Excessive gas, Gets full quickly at meals, Hemorrhoids, Indigestion, Nausea, Rectal Pain and Vomiting. Female Genitourinary Present- Frequency. Not Present- Nocturia, Painful Urination, Pelvic Pain and Urgency. Neurological Not Present- Decreased Memory, Fainting, Headaches, Numbness, Seizures, Tingling, Tremor, Trouble walking and Weakness. Psychiatric Not Present- Anxiety, Bipolar, Change in Sleep Pattern, Depression, Fearful and Frequent crying. Endocrine Not Present- Cold Intolerance, Excessive Hunger, Hair Changes, Heat Intolerance, Hot flashes and New Diabetes. Hematology Not Present- Blood Thinners, Easy Bruising, Excessive bleeding, Gland problems, HIV and Persistent Infections. All other systems negative  BP (!) 146/85    Pulse 95    Temp 98.7 F (37.1 C) (Oral)    Resp 15    Ht 5\' 7"  (1.702 m)    Wt 86.4 kg    LMP 08/31/2016    SpO2 99%    BMI 29.82 kg/m    Physical Exam The physical exam findings are as follows: Note: Constitutional: No acute distress, conversant, appears stated age  Eyes: Anicteric sclerae, moist conjunctiva, no lid lag  Neck: No thyromegaly, trachea midline, no cervical lymphadenopathy  Lungs: Clear to auscultation biilaterally, normal respiratory effot  Cardiovascular: regular rate & rhythm, no murmurs, no peripheal edema, pedal pulses 2+  GI: Soft, no masses or hepatosplenomegaly, non-tender to palpation  MSK: Normal gait, no clubbing cyanosis, edema  Skin: No rashes, palpation reveals normal skin turgor  Psychiatric: Appropriate  judgment and insight, oriented to person, place, and time  Abdomen Inspection Hernias - Incisional - Reducible (In the midline superior  to the umbilicus, about 1-2 cm) .    Assessment & Plan  INCISIONAL HERNIA, WITHOUT OBSTRUCTION OR GANGRENE (K43.2) Impression: Patient is a 54 year old female with an incisional hernia, fibroids to undergo robotic partial hysterectomy. The patient will like to try to coordinate the incisional hernia repair with mesh the same time as the partial hysterectomy.  1. Will proceed to the operating room for laparoscopic versus robotic incisional hernia repair with mesh. 2. I discussed with the patient the signs and symptoms of incarceration and strangulation and the need to proceed to the ER should they occur.  3. I discussed with the patient the risks and benefits of the procedure to include but not limited to: Infection, bleeding, damage to surrounding structures, possible need for further surgery, possible nerve pain, and possible recurrence. The patient was understanding and wishes to proceed.

## 2020-06-16 NOTE — Op Note (Signed)
06/16/2020  12:21 PM  PATIENT:  Patricia Mckee  54 y.o. female  PRE-OPERATIVE DIAGNOSIS:  FIBROIDS  POST-OPERATIVE DIAGNOSIS:  FIBROIDS  PROCEDURE:  Procedure(s): XI ROBOTIC ASSISTED LAPAROSCOPIC HYSTERECTOMY AND SALPINGECTOMY (Bilateral) HERNIA REPAIR UMBILICAL ADULT (N/Mckee)  SURGEON:  Surgeon(s) and Role: Panel 1:    * Bobbye Charleston, MD - Primary    * Taam-Akelman, Lawrence Santiago, MD - Assisting Panel 2:    * Ralene Ok, MD - Primary  ANESTHESIA:   general  EBL:  50 mL   LOCAL MEDICATIONS USED:  OTHER Ropivicaine  SPECIMEN:  Source of Specimen:  uterus, tubes, cervix  DISPOSITION OF SPECIMEN:  PATHOLOGY  COUNTS:  YES  TOURNIQUET:  * No tourniquets in log *  DICTATION: .Note written in EPIC  PLAN OF CARE: Admit for overnight observation  PATIENT DISPOSITION:  PACU - hemodynamically stable.   Delay start of Pharmacological VTE agent (>24hrs) due to surgical blood loss or risk of bleeding: not applicable  Complications:  None.  Findings:  14 weeks size uterus, wider than tall.  Ovaries were normal.  The ureters were identified during multiple points of the case and were always out of the field of dissection.  On cystoscopy, the bladder was intact and bilateral spill was seen from each ureteral orriface.  The bladder was backfilled to ensure that there was no thinning of tissue after the peritoneal dissection.    Medications:  Ancef.  Ropivicaine.      Technique:   After adequate anesthesia was achieved the patient was positioned, prepped and draped in usual sterile fashion.  Mckee speculum was placed in the vagina and the cervix dilated with pratt dilators.  The 2.5 cm Koh ring Advincula was assembled and placed in proper fashion.  The  Speculum was removed and the bladder catheterized with Mckee foley.     Attention was turned to the abdomen where Mckee 1 cm incision was made 1 cm below both the umbilicus and hernia  The veress needle was introduced without aspiration  of bowel contents or blood and the abdomen insufflated. The 8.5 mm Robotic trocar was placed and the other three trocar sites were marked out, all approximately 10 cm from each other and the camera.  Two 8.5 mm trocars were placed on either side of the camera port and Mckee 5 mm assistant port was placed 3 cm above the line of the other trocars.  All trocars were inserted under direct visualization of the camera.  The patient was placed in trendelenburg and then the Robot docked.  The fenestrated bipolar were placed on arm 1 and the Hot shears on arm 3 and introduced under direct visualization of the camera.   I then broke scrub and sat down at the console.  The above findings were noted and the ureters identified well out of the field of dissection.  The peritoneum of the bladder was extended over the uterus and Mckee small tether of bowel was attached to this.  The peritoneum was removed with the scissors and the tether dropped down.  The tether was released from the bowel with the cautery well away from the bowel on the peritoneum.  The right fallopian tube was isolated and cauterized with the bipolar.  The Utero-ovarian ligament was then divided with the bipolar cautery and shears.  The posterior broad ligament was then divided with the hot shears until the uterosacral ligament.  The Broad and cardinal ligaments were then cauterized against the cervix to the level of the  Koh ring, securing the uterine artery.  Each pedicle was then incised with the shears.  The anterior leaf was then incised at the reflection of the vessico-uterine junction and the lateral bladder retracted inferiorly after the round ligament had been divided with the bipolar forceps.  The left tube was cauterized with the bipolar and divided with the shears;  then the left utero-ovarian ligament divided with the bipolar forceps and the scissors.  The round ligament was divided as well and the posterior leaf of the broad ligament then divided with the  hot shears. The broad and cardinal ligaments were then cauterized on the left in the same way.   At the level of the internal os, the uterine arteries were bilaterally cauterized with the bipolar.  The ureters were identified well out of the field of dissection.     The bladder was then able to be retracted inferiorly and the vesico-uterine fascia was incised in the midline until the bladder was removed one cm below the Koh ring.  The hot shears then circumferentially incised the vagina at the level of the reflection on the [facility] ring.  Once the uterus and cervix were amputated, cautery was used to insure hemostasis of the cuff.  Once hemostasis was achieved, the scissors were changed to the mega suture cut needle driver and the cuff was closed with Mckee running stitches of 0-vicryl V loc.  Cautery was used to ensure hemostasis of the right pedicles very superficially. The ureters were peristalsing bilaterally well and very lateral to the areas of operation.     The Robot was then undocked and I scrubbed back in.  The needle was removed and Ropivicaine was introduced into the pelvis. The skin incisions were left as Dr. Rosendo Gros was to use two of them and was kind enough to plan to close them all when he was done. The instruments were removed from the vagina and cystoscopy performed, revealing an intact bladder and vigourous spill of urine from each ureteral orifice.  The cystoscope was removed and Dr. Rosendo Gros was present to begin his repair of the umbilical hernia.     Patricia Mckee

## 2020-06-16 NOTE — Anesthesia Postprocedure Evaluation (Signed)
Anesthesia Post Note  Patient: Oluwaseyi Tull  Procedure(s) Performed: XI ROBOTIC ASSISTED LAPAROSCOPIC HYSTERECTOMY AND SALPINGECTOMY (Bilateral ) LAPAROSCOPIC UMBILICAL HERNIA REPAIR  WITH MESH (N/A )     Patient location during evaluation: PACU Anesthesia Type: General Level of consciousness: sedated, patient cooperative and oriented Pain management: pain level controlled Vital Signs Assessment: post-procedure vital signs reviewed and stable Respiratory status: spontaneous breathing, nonlabored ventilation and respiratory function stable Cardiovascular status: blood pressure returned to baseline and stable Postop Assessment: no apparent nausea or vomiting Anesthetic complications: no   No complications documented.  Last Vitals:  Vitals:   06/16/20 1345 06/16/20 1400  BP: (!) 143/93 (!) (P) 125/91  Pulse: 78 (P) 79  Resp: 17 (P) 16  Temp:  (P) 36.9 C  SpO2: 98% (P) 100%    Last Pain:  Vitals:   06/16/20 1400  TempSrc:   PainSc: (P) 5                  Joab Carden,E. Zenobia Kuennen

## 2020-06-16 NOTE — Discharge Summary (Signed)
Physician Discharge Summary  Patient ID: Patricia Mckee MRN: [id number] DOB/AGE: 12-28-1965 54 y.o.  Admit date: 06/16/2020 Discharge date: 06/16/2020  Admission Diagnoses:symptomatic fibroid uterus and incisional umbilical hernia.  Discharge Diagnoses: same Active Problems:   * No active hospital problems. *   Discharged Condition: good  Hospital Course: Uncomplicated robotic TLH, salpingectomies, cysto, umbilical hernia repair by Dr. Rosendo Gros    Consults: None  Significant Diagnostic Studies: none  Treatments: surgery: Uncomplicated robotic TLH, salpingectomies, cysto, umbilical hernia repair by Dr. Rosendo Gros     Discharge Exam: Blood pressure (!) 146/85, pulse 95, temperature 98.7 F (37.1 C), temperature source Oral, resp. rate 15, height 5\' 7"  (1.702 m), weight 86.4 kg, last menstrual period 08/31/2016, SpO2 99 %.   Disposition: Discharge disposition: 01-Home or Self Care       Discharge Instructions    Call MD for:  temperature >100.4   Complete by: As directed    Diet - low sodium heart healthy   Complete by: As directed    Discharge instructions   Complete by: As directed    No driving on narcotics, no sexual activity for 2 weeks.   Increase activity slowly   Complete by: As directed    May shower / Bathe   Complete by: As directed    Shower, no bath for 2 weeks.   Remove dressing in 24 hours   Complete by: As directed    Sexual Activity Restrictions   Complete by: As directed    No sexual activity for 2 weeks.     Allergies as of 06/16/2020      Reactions   Amoxicillin-pot Clavulanate Diarrhea   C-diff   Ace Inhibitors Other (See Comments), Cough   Cough   Amlodipine Besy-benazepril Hcl Other (See Comments)   cough      Medication List    STOP taking these medications   aspirin EC 81 MG tablet   ibuprofen 200 MG tablet Commonly known as: ADVIL     TAKE these medications   Jardiance 25 MG Tabs tablet Generic drug:  empagliflozin Take 25 mg by mouth daily.   losartan-hydrochlorothiazide 100-25 MG tablet Commonly known as: HYZAAR Take 1 tablet by mouth daily.   metFORMIN 1000 MG tablet Commonly known as: GLUCOPHAGE Take 1,000 mg by mouth 2 (two) times daily.   oxyCODONE-acetaminophen 5-325 MG tablet Commonly known as: PERCOCET/ROXICET Take 1 tablet by mouth every 4 (four) hours as needed for severe pain.   simvastatin 10 MG tablet Commonly known as: ZOCOR Take 10 mg by mouth at bedtime.       Follow-up Information    Bobbye Charleston, MD Follow up in 2 week(s).   Specialty: Obstetrics and Gynecology Contact information: [address] [place] [address] [phone]               Signed: Daria Pastures 06/16/2020, 12:35 PM

## 2020-06-16 NOTE — Discharge Instructions (Signed)
Umbilical Hernia, Adult  A hernia is a bulge of tissue that pushes through an opening between muscles. An umbilical hernia happens in the abdomen, near the belly button (umbilicus). The hernia may contain tissues from the small intestine, large intestine, or fatty tissue covering the intestines (omentum). Umbilical hernias in adults tend to get worse over time, and they require surgical treatment. There are several types of umbilical hernias. You may have:  A hernia located just above or below the umbilicus (indirect hernia). This is the most common type of umbilical hernia in adults.  A hernia that forms through an opening formed by the umbilicus (direct hernia).  A hernia that comes and goes (reducible hernia). A reducible hernia may be visible only when you strain, lift something heavy, or cough. This type of hernia can be pushed back into the abdomen (reduced).  A hernia that traps abdominal tissue inside the hernia (incarcerated hernia). This type of hernia cannot be reduced.  A hernia that cuts off blood flow to the tissues inside the hernia (strangulated hernia). The tissues can start to die if this happens. This type of hernia requires emergency treatment. What are the causes? An umbilical hernia happens when tissue inside the abdomen presses on a weak area of the abdominal muscles. What increases the risk? You may have a greater risk of this condition if you:  Are obese.  Have had several pregnancies.  Have a buildup of fluid inside your abdomen (ascites).  Have had surgery that weakens the abdominal muscles. What are the signs or symptoms? The main symptom of this condition is a painless bulge at or near the belly button. A reducible hernia may be visible only when you strain, lift something heavy, or cough. Other symptoms may include:  Dull pain.  A feeling of pressure. Symptoms of a strangulated hernia may include:  Pain that gets increasingly worse.  Nausea and  vomiting.  Pain when pressing on the hernia.  Skin over the hernia becoming red or purple.  Constipation.  Blood in the stool. How is this diagnosed? This condition may be diagnosed based on:  A physical exam. You may be asked to cough or strain while standing. These actions increase the pressure inside your abdomen and force the hernia through the opening in your muscles. Your health care provider may try to reduce the hernia by pressing on it.  Your symptoms and medical history. How is this treated? Surgery is the only treatment for an umbilical hernia. Surgery for a strangulated hernia is done as soon as possible. If you have a small hernia that is not incarcerated, you may need to lose weight before having surgery. Follow these instructions at home:  Lose weight, if told by your health care provider.  Do not try to push the hernia back in.  Watch your hernia for any changes in color or size. Tell your health care provider if any changes occur.  You may need to avoid activities that increase pressure on your hernia.  Do not lift anything that is heavier than 10 lb (4.5 kg) until your health care provider says that this is safe.  Take over-the-counter and prescription medicines only as told by your health care provider.  Keep all follow-up visits as told by your health care provider. This is important. Contact a health care provider if:  Your hernia gets larger.  Your hernia becomes painful. Get help right away if:  You develop sudden, severe pain near the area of your hernia.    You have pain as well as nausea or vomiting.  You have pain and the skin over your hernia changes color.  You develop a fever. This information is not intended to replace advice given to you by your health care provider. Make sure you discuss any questions you have with your health care provider. Document Revised: 10/18/2017 Document Reviewed: 03/06/2017 Elsevier Patient Education  [place]. Laparoscopically Assisted Vaginal Hysterectomy, Care After This sheet gives you information about how to care for yourself after your procedure. Your health care provider may also give you more specific instructions. If you have problems or questions, contact your health care provider. What can I expect after the procedure? After the procedure, it is common to have:  Soreness and numbness in your incision areas.  Abdominal pain. You will be given pain medicine to control it.  Vaginal bleeding and discharge. You will need to use a sanitary napkin after this procedure.  Sore throat from the breathing tube that was inserted during surgery. Follow these instructions at home: Medicines  Take over-the-counter and prescription medicines only as told by your health care provider.  Do not take aspirin or ibuprofen. These medicines can cause bleeding.  Do not drive or use heavy machinery while taking prescription pain medicine.  Do not drive for 24 hours if you were given a medicine to help you relax (sedative) during the procedure. Incision care   Follow instructions from your health care provider about how to take care of your incisions. Make sure you: ? Wash your hands with soap and water before you change your bandage (dressing). If soap and water are not available, use hand sanitizer. ? Change your dressing as told by your health care provider. ? Leave stitches (sutures), skin glue, or adhesive strips in place. These skin closures may need to stay in place for 2 weeks or longer. If adhesive strip edges start to loosen and curl up, you may trim the loose edges. Do not remove adhesive strips completely unless your health care provider tells you to do that.  Check your incision area every day for signs of infection. Check for: ? Redness, swelling, or pain. ? Fluid or blood. ? Warmth. ? Pus or a bad smell. Activity  Get regular exercise as told by your health care provider.  You may be told to take short walks every day and go farther each time.  Return to your normal activities as told by your health care provider. Ask your health care provider what activities are safe for you.  Do not douche, use tampons, or have sexual intercourse for at least 6 weeks, or until your health care provider gives you permission.  Do not lift anything that is heavier than 10 lb (4.5 kg), or the limit that your health care provider tells you, until he or she says that it is safe. General instructions  Do not take baths, swim, or use a hot tub until your health care provider approves. Take showers instead of baths.  Do not drive for 24 hours if you received a sedative.  Do not drive or operate heavy machinery while taking prescription pain medicine.  To prevent or treat constipation while you are taking prescription pain medicine, your health care provider may recommend that you: ? Drink enough fluid to keep your urine clear or pale yellow. ? Take over-the-counter or prescription medicines. ? Eat foods that are high in fiber, such as fresh fruits and vegetables, whole grains, and beans. ? Limit  foods that are high in fat and processed sugars, such as fried and sweet foods.  Keep all follow-up visits as told by your health care provider. This is important. Contact a health care provider if:  You have signs of infection, such as: ? Redness, swelling, or pain around your incision sites. ? Fluid or blood coming from an incision. ? An incision that feels warm to the touch. ? Pus or a bad smell coming from an incision.  Your incision breaks open.  Your pain medicine is not helping.  You feel dizzy or light-headed.  You have pain or bleeding when you urinate.  You have persistent nausea and vomiting.  You have blood, pus, or a bad-smelling discharge from your vagina. Get help right away if:  You have a fever.  You have severe abdominal pain.  You have chest  pain.  You have shortness of breath.  You faint.  You have pain, swelling, or redness in your leg.  You have heavy bleeding from your vagina. Summary  After the procedure, it is common to have abdominal pain and vaginal bleeding.  You should not drive or lift heavy objects until your health care provider says that it is safe.  Contact your health care provider if you have any symptoms of infection, excessive vaginal bleeding, nausea, vomiting, or shortness of breath. This information is not intended to replace advice given to you by your health care provider. Make sure you discuss any questions you have with your health care provider. Document Revised: 08/18/2017 Document Reviewed: 11/01/2016 Elsevier Patient Education  [place] Instructions  Activity: Get plenty of rest for the remainder of the day. A responsible individual must stay with you for 24 hours following the procedure.  For the next 24 hours, DO NOT: -Drive a car -Paediatric nurse -Drink alcoholic beverages -Take any medication unless instructed by your physician -Make any legal decisions or sign important papers.  Meals: Start with liquid foods such as gelatin or soup. Progress to regular foods as tolerated. Avoid greasy, spicy, heavy foods. If nausea and/or vomiting occur, drink only clear liquids until the nausea and/or vomiting subsides. Call your physician if vomiting continues.  Special Instructions/Symptoms: Your throat may feel dry or sore from the anesthesia or the breathing tube placed in your throat during surgery. If this causes discomfort, gargle with warm salt water. The discomfort should disappear within 24 hours.  If you had a scopolamine patch placed behind your ear for the management of post- operative nausea and/or vomiting:  1. The medication in the patch is effective for 72 hours, after which it should be removed.  Wrap patch in a tissue and discard in the  trash. Wash hands thoroughly with soap and water. 2. You may remove the patch earlier than 72 hours if you experience unpleasant side effects which may include dry mouth, dizziness or visual disturbances. 3. Avoid touching the patch. Wash your hands with soap and water after contact with the patch.

## 2020-06-16 NOTE — Brief Op Note (Signed)
06/16/2020  12:21 PM  PATIENT:  Patricia Mckee  54 y.o. female  PRE-OPERATIVE DIAGNOSIS:  FIBROIDS  POST-OPERATIVE DIAGNOSIS:  FIBROIDS  PROCEDURE:  Procedure(s): XI ROBOTIC ASSISTED LAPAROSCOPIC HYSTERECTOMY AND SALPINGECTOMY (Bilateral) HERNIA REPAIR UMBILICAL ADULT (N/A)  SURGEON:  Surgeon(s) and Role: Panel 1:    * Bobbye Charleston, MD - Primary    * Taam-Akelman, Lawrence Santiago, MD - Assisting Panel 2:    * Ralene Ok, MD - Primary  ANESTHESIA:   general  EBL:  50 mL   LOCAL MEDICATIONS USED:  OTHER Ropivicaine  SPECIMEN:  Source of Specimen:  uterus, tubes, cervix  DISPOSITION OF SPECIMEN:  PATHOLOGY  COUNTS:  YES  TOURNIQUET:  * No tourniquets in log *  DICTATION: .Note written in EPIC  PLAN OF CARE: Admit for overnight observation  PATIENT DISPOSITION:  PACU - hemodynamically stable.   Delay start of Pharmacological VTE agent (>24hrs) due to surgical blood loss or risk of bleeding: not applicable

## 2020-06-17 ENCOUNTER — Encounter (HOSPITAL_BASED_OUTPATIENT_CLINIC_OR_DEPARTMENT_OTHER): Payer: Self-pay | Admitting: Obstetrics and Gynecology

## 2020-06-17 LAB — SURGICAL PATHOLOGY

## 2020-07-21 ENCOUNTER — Other Ambulatory Visit: Payer: Self-pay | Admitting: Obstetrics and Gynecology

## 2020-07-21 DIAGNOSIS — Z1231 Encounter for screening mammogram for malignant neoplasm of breast: Secondary | ICD-10-CM

## 2020-09-02 ENCOUNTER — Other Ambulatory Visit: Payer: Self-pay

## 2020-09-02 ENCOUNTER — Ambulatory Visit
Admission: RE | Admit: 2020-09-02 | Discharge: 2020-09-02 | Disposition: A | Discharge status: Home / Self Care | Payer: No Typology Code available for payment source | Source: Ambulatory Visit | Attending: Obstetrics and Gynecology | Admitting: Obstetrics and Gynecology

## 2020-09-02 DIAGNOSIS — Z1231 Encounter for screening mammogram for malignant neoplasm of breast: Secondary | ICD-10-CM

## 2021-02-22 ENCOUNTER — Other Ambulatory Visit: Payer: Self-pay

## 2021-02-22 ENCOUNTER — Emergency Department (HOSPITAL_BASED_OUTPATIENT_CLINIC_OR_DEPARTMENT_OTHER)
Admission: EM | Admit: 2021-02-22 | Discharge: 2021-02-22 | Disposition: A | Discharge status: Home / Self Care | Payer: BC Managed Care – PPO | Attending: Emergency Medicine | Admitting: Emergency Medicine

## 2021-02-22 ENCOUNTER — Encounter (HOSPITAL_BASED_OUTPATIENT_CLINIC_OR_DEPARTMENT_OTHER): Payer: Self-pay

## 2021-02-22 ENCOUNTER — Emergency Department (HOSPITAL_BASED_OUTPATIENT_CLINIC_OR_DEPARTMENT_OTHER): Payer: BC Managed Care – PPO

## 2021-02-22 DIAGNOSIS — E119 Type 2 diabetes mellitus without complications: Secondary | ICD-10-CM | POA: Diagnosis not present

## 2021-02-22 DIAGNOSIS — M79661 Pain in right lower leg: Secondary | ICD-10-CM | POA: Diagnosis not present

## 2021-02-22 DIAGNOSIS — Z7984 Long term (current) use of oral hypoglycemic drugs: Secondary | ICD-10-CM | POA: Diagnosis not present

## 2021-02-22 DIAGNOSIS — I10 Essential (primary) hypertension: Secondary | ICD-10-CM | POA: Insufficient documentation

## 2021-02-22 DIAGNOSIS — Z87891 Personal history of nicotine dependence: Secondary | ICD-10-CM | POA: Diagnosis not present

## 2021-02-22 DIAGNOSIS — M79671 Pain in right foot: Secondary | ICD-10-CM | POA: Insufficient documentation

## 2021-02-22 DIAGNOSIS — Z79899 Other long term (current) drug therapy: Secondary | ICD-10-CM | POA: Insufficient documentation

## 2021-02-22 DIAGNOSIS — R600 Localized edema: Secondary | ICD-10-CM | POA: Diagnosis present

## 2021-02-22 NOTE — ED Provider Notes (Signed)
[facility] EMERGENCY DEPARTMENT Provider Note   CSN: [id number] Arrival date & time: 02/22/21  2016     History Chief Complaint  Patient presents with  . Leg Pain    Patricia Mckee is a 55 y.o. female.  The history is provided by the patient.   Patricia Mckee is a 55 y.o. female who presents to the Emergency Department complaining of leg pain and foot swelling. She presents to the emergency department complaining of pain and swelling to the right calf and leg. She also has swelling to the left foot but not as much pain in that area. Last week she did have multiple long car trips. She went to urgent care and was referred to the emergency department to rule out blood clot. No associated fevers, chest pain, shortness of breath. She has a history of well-controlled diabetes and hypertension. Pain is located throughout the medial aspect of the calf down through the foot.     Past Medical History:  Diagnosis Date  . Anemia   . Diabetes mellitus without complication ([place])   . Fibroids   . GERD (gastroesophageal reflux disease)   . Hyperlipidemia   . Hypertension     Patient Active Problem List   Diagnosis Date Noted  . Postoperative state 06/16/2020    Past Surgical History:  Procedure Laterality Date  . ABDOMINAL HYSTERECTOMY    . MYOMECTOMY    . ROBOTIC ASSISTED LAPAROSCOPIC HYSTERECTOMY AND SALPINGECTOMY Bilateral 06/16/2020   Procedure: XI ROBOTIC ASSISTED LAPAROSCOPIC HYSTERECTOMY AND SALPINGECTOMY;  Surgeon: Bobbye Charleston, MD;  Location: [facility];  Service: Gynecology;  Laterality: Bilateral;  . UMBILICAL HERNIA REPAIR N/A 06/16/2020   Procedure: LAPAROSCOPIC UMBILICAL HERNIA REPAIR  WITH MESH;  Surgeon: Ralene Ok, MD;  Location: [facility];  Service: General;  Laterality: N/A;  . WISDOM TOOTH EXTRACTION       OB History   No obstetric history on file.     Family History  Problem Relation Age of Onset   . Breast cancer Neg Hx     Social History   Tobacco Use  . Smoking status: Former Smoker    Packs/day: 1.00    Types: Cigarettes  . Smokeless tobacco: Never Used  Vaping Use  . Vaping Use: Every day  Substance Use Topics  . Alcohol use: Yes    Comment: occ  . Drug use: Never    Home Medications Prior to Admission medications   Medication Sig Start Date End Date Taking? Authorizing Provider  JARDIANCE 25 MG TABS tablet Take 25 mg by mouth daily. 04/28/20   [provider]  losartan-hydrochlorothiazide (HYZAAR) 100-25 MG tablet Take 1 tablet by mouth daily.    [provider]  metFORMIN (GLUCOPHAGE) 1000 MG tablet Take 1,000 mg by mouth 2 (two) times daily. 04/06/20   [provider]  oxyCODONE-acetaminophen (PERCOCET/ROXICET) 5-325 MG tablet Take 1 tablet by mouth every 4 (four) hours as needed for severe pain. 06/16/20   Bobbye Charleston, MD  simvastatin (ZOCOR) 10 MG tablet Take 10 mg by mouth at bedtime. 05/06/20   [provider]    Allergies    Amoxicillin-pot clavulanate, Ace inhibitors, and Amlodipine besy-benazepril hcl  Review of Systems   Review of Systems  All other systems reviewed and are negative.   Physical Exam Updated Vital Signs BP (!) 155/89 (BP Location: Right Arm)   Pulse 92   Temp 97.9 F (36.6 C) (Oral)   Resp 20   Ht 5'  7" (1.702 m)   Wt 86.6 kg   LMP 08/31/2016   SpO2 100%   BMI 29.91 kg/m   Physical Exam Vitals and nursing note reviewed.  Constitutional:      Appearance: She is well-developed.  HENT:     Head: Normocephalic and atraumatic.  Cardiovascular:     Rate and Rhythm: Normal rate and regular rhythm.  Pulmonary:     Effort: Pulmonary effort is normal.     Breath sounds: Normal breath sounds.  Musculoskeletal:     Comments: 2+ DP pulses bilaterally. There is non pitting edema to bilateral lower extremities. There is no overlying erythema or rashes. Flexion extension intact at the ankles.   Skin:    General: Skin is warm and dry.  Neurological:     Mental Status: She is alert and oriented to person, place, and time.  Psychiatric:        Behavior: Behavior normal.     ED Results / Procedures / Treatments   Labs (all labs ordered are listed, but only abnormal results are displayed) Labs Reviewed - No data to display  EKG None  Radiology US Venous Img Lower Unilateral Right  Result Date: 02/22/2021 CLINICAL DATA:  Foot swelling, right greater than left EXAM: RIGHT LOWER EXTREMITY VENOUS DOPPLER ULTRASOUND TECHNIQUE: Gray-scale sonography with compression, as well as color and duplex ultrasound, were performed to evaluate the deep venous system(s) from the level of the common femoral vein through the popliteal and proximal calf veins. COMPARISON:  None. FINDINGS: VENOUS Normal compressibility of the common femoral, superficial femoral, and popliteal veins, as well as the visualized calf veins. Visualized portions of profunda femoral vein and great saphenous vein unremarkable. No filling defects to suggest DVT on grayscale or color Doppler imaging. Doppler waveforms show normal direction of venous flow, normal respiratory plasticity and response to augmentation. Limited views of the contralateral common femoral vein are unremarkable. OTHER None. Limitations: none IMPRESSION: Negative. Electronically Signed   By: Julian Hy M.D.   On: 02/22/2021 21:17    Procedures Procedures   Medications Ordered in ED Medications - No data to display  ED Course  I have reviewed the triage vital signs and the nursing notes.  Pertinent labs & imaging results that were available during my care of the patient were reviewed by me and considered in my medical decision making (see chart for details).    MDM Rules/Calculators/A&P                         patient here for evaluation of bilateral lower extremity edema with swelling and pain to the right foot and calf. She did recently have  prolonged travel. Imaging is negative for acute DVT. Examination is not consistent with acute infectious process. Discussed possible causes of lower extremity edema as well as home care options such as elevation, dietary changes, compression stockings. Patient request medication for her symptoms. Discussed that it is recommended that renal function with electrolytes are checked before initiating medication such as this due to risk of hypokalemia and electrolyte derangements. Patient declines lab draw at this time. Patient left the department before receiving discharge paperwork.  Final Clinical Impression(s) / ED Diagnoses Final diagnoses:  None    Rx / DC Orders ED Discharge Orders    None       Quintella Reichert, MD 02/23/21 [phone]

## 2021-02-22 NOTE — ED Triage Notes (Addendum)
Pt c/o right calf pain, swelling to bilat feet-sx started 4 days ago after several long car rides-states she was sent from [facility] to r/o blood clot right LE-NAD-steady gait

## 2021-08-03 ENCOUNTER — Other Ambulatory Visit: Payer: Self-pay | Admitting: Obstetrics and Gynecology

## 2021-08-03 DIAGNOSIS — Z1231 Encounter for screening mammogram for malignant neoplasm of breast: Secondary | ICD-10-CM

## 2021-09-06 ENCOUNTER — Ambulatory Visit
Admission: RE | Admit: 2021-09-06 | Discharge: 2021-09-06 | Disposition: A | Discharge status: Home / Self Care | Payer: BC Managed Care – PPO | Source: Ambulatory Visit | Attending: Obstetrics and Gynecology | Admitting: Obstetrics and Gynecology

## 2021-09-06 ENCOUNTER — Other Ambulatory Visit: Payer: Self-pay

## 2021-09-06 DIAGNOSIS — Z1231 Encounter for screening mammogram for malignant neoplasm of breast: Secondary | ICD-10-CM

## 2021-09-08 ENCOUNTER — Other Ambulatory Visit: Payer: Self-pay | Admitting: Obstetrics and Gynecology

## 2021-09-08 DIAGNOSIS — R928 Other abnormal and inconclusive findings on diagnostic imaging of breast: Secondary | ICD-10-CM

## 2021-10-11 ENCOUNTER — Other Ambulatory Visit: Payer: Self-pay | Admitting: Obstetrics and Gynecology

## 2021-10-11 ENCOUNTER — Ambulatory Visit
Admission: RE | Admit: 2021-10-11 | Discharge: 2021-10-11 | Disposition: A | Discharge status: Home / Self Care | Payer: BC Managed Care – PPO | Source: Ambulatory Visit | Attending: Obstetrics and Gynecology | Admitting: Obstetrics and Gynecology

## 2021-10-11 DIAGNOSIS — R921 Mammographic calcification found on diagnostic imaging of breast: Secondary | ICD-10-CM

## 2021-10-11 DIAGNOSIS — R928 Other abnormal and inconclusive findings on diagnostic imaging of breast: Secondary | ICD-10-CM

## 2021-10-20 HISTORY — PX: BREAST BIOPSY: SHX20

## 2021-11-08 ENCOUNTER — Ambulatory Visit
Admission: RE | Admit: 2021-11-08 | Discharge: 2021-11-08 | Disposition: A | Discharge status: Home / Self Care | Payer: BC Managed Care – PPO | Source: Ambulatory Visit | Attending: Obstetrics and Gynecology | Admitting: Obstetrics and Gynecology

## 2021-11-08 DIAGNOSIS — R921 Mammographic calcification found on diagnostic imaging of breast: Secondary | ICD-10-CM

## 2023-05-12 ENCOUNTER — Other Ambulatory Visit: Payer: Self-pay | Admitting: Family

## 2023-05-12 DIAGNOSIS — Z1231 Encounter for screening mammogram for malignant neoplasm of breast: Secondary | ICD-10-CM

## 2023-05-24 ENCOUNTER — Ambulatory Visit
Admission: RE | Admit: 2023-05-24 | Discharge: 2023-05-24 | Disposition: A | Discharge status: Home / Self Care | Payer: BC Managed Care – PPO | Source: Ambulatory Visit | Attending: Family | Admitting: Family

## 2023-05-24 DIAGNOSIS — Z1231 Encounter for screening mammogram for malignant neoplasm of breast: Secondary | ICD-10-CM

## 2023-08-04 IMAGING — MG MM BREAST BX W/ LOC DEV 1ST LESION IMAGE BX SPEC STEREO GUIDE*R*
6 of 11 series · 6 of 35 positions shown · non-contrast
Comparison: Previous exams.
COMPARISON: Previous exams.

Addendum:
CLINICAL DATA: 5 mm group of calcifications in the upper-outer
right breast at recent mammography.

EXAM:
RIGHT BREAST STEREOTACTIC CORE NEEDLE BIOPSY

[R (1 of 2)]
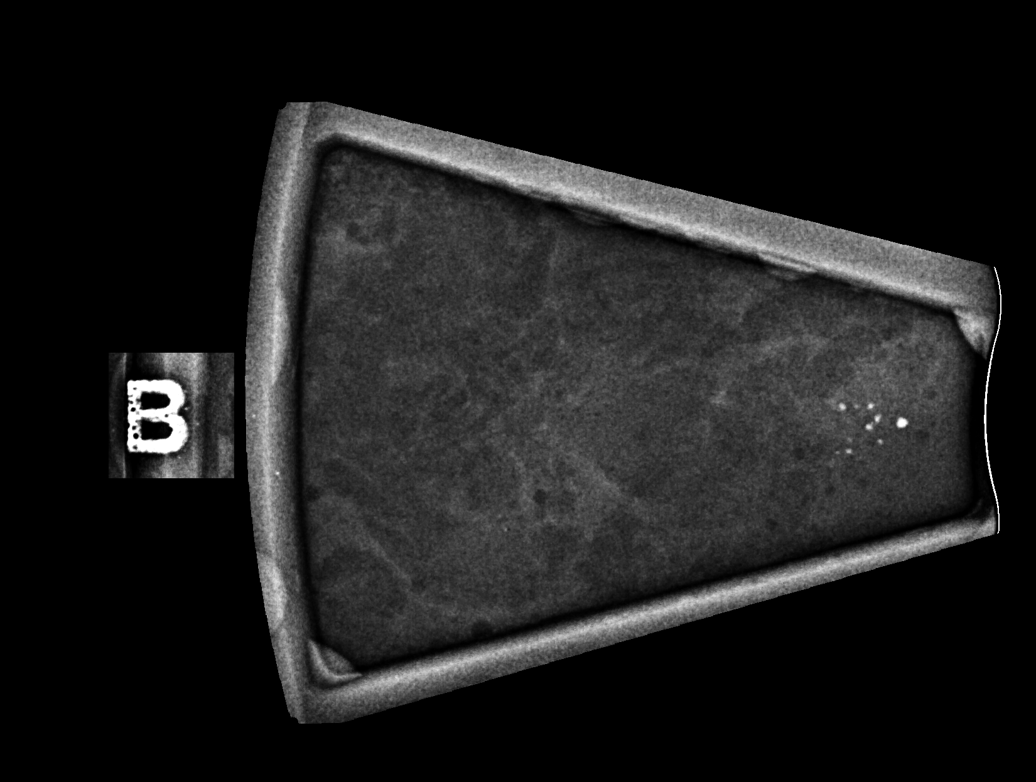

[R (2 of 2)]
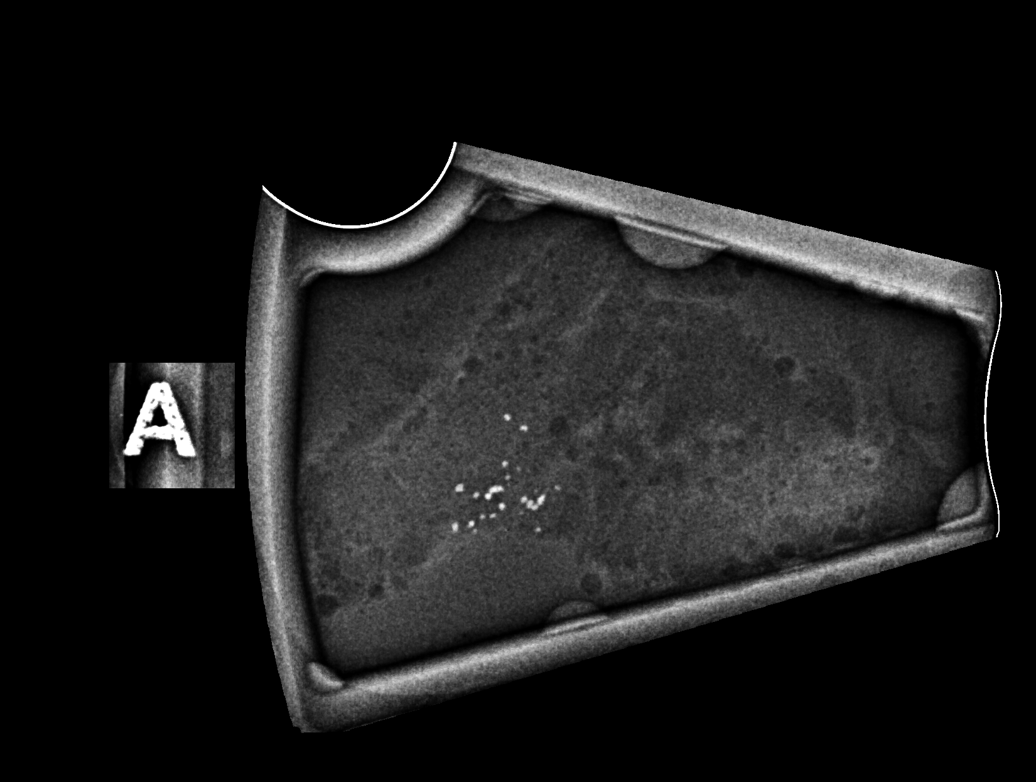

[R LM (1 of 3)]
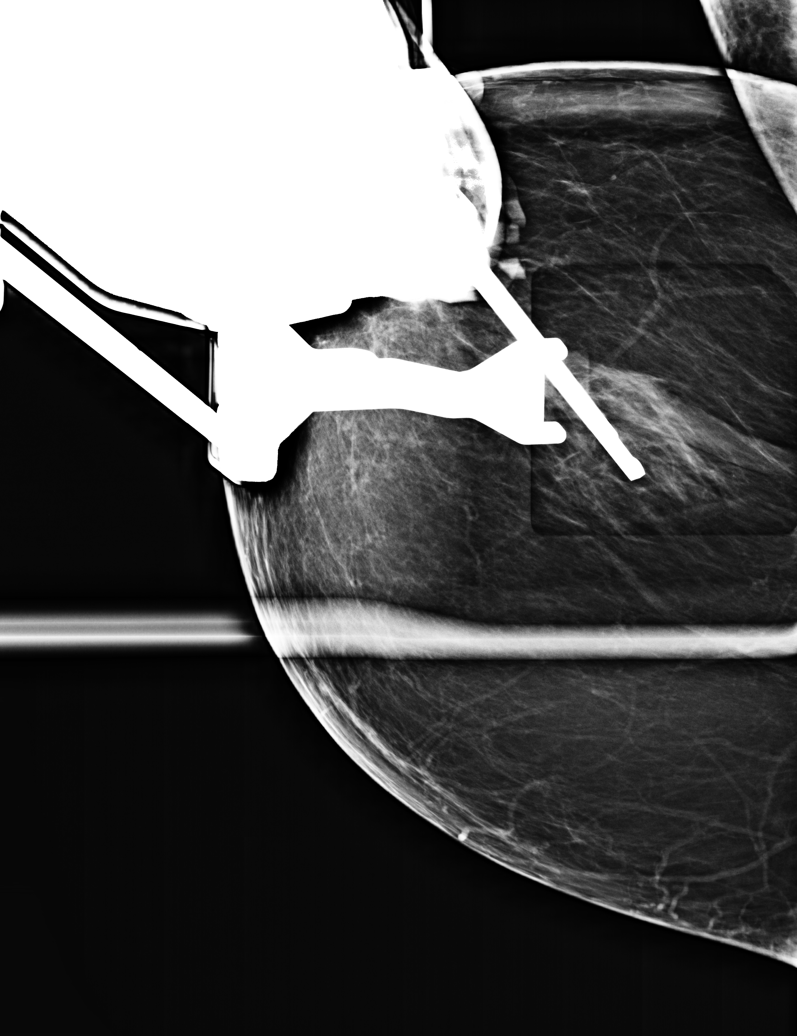

[R LM (2 of 3)]
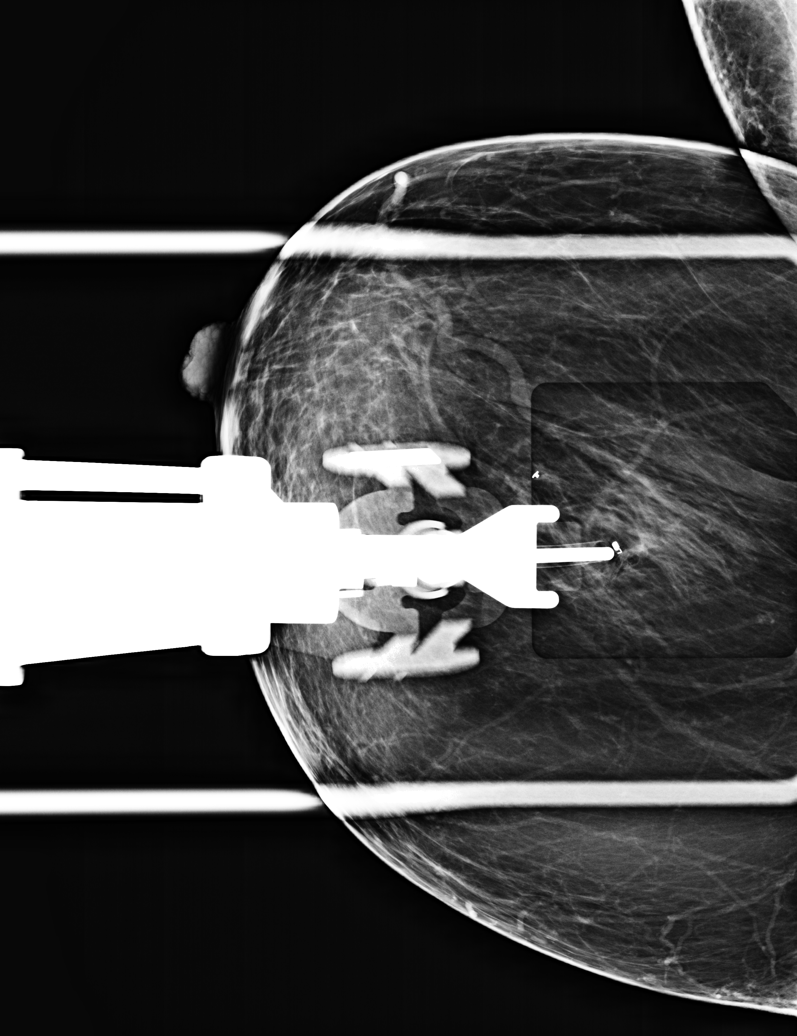

[R LM (3 of 3)]
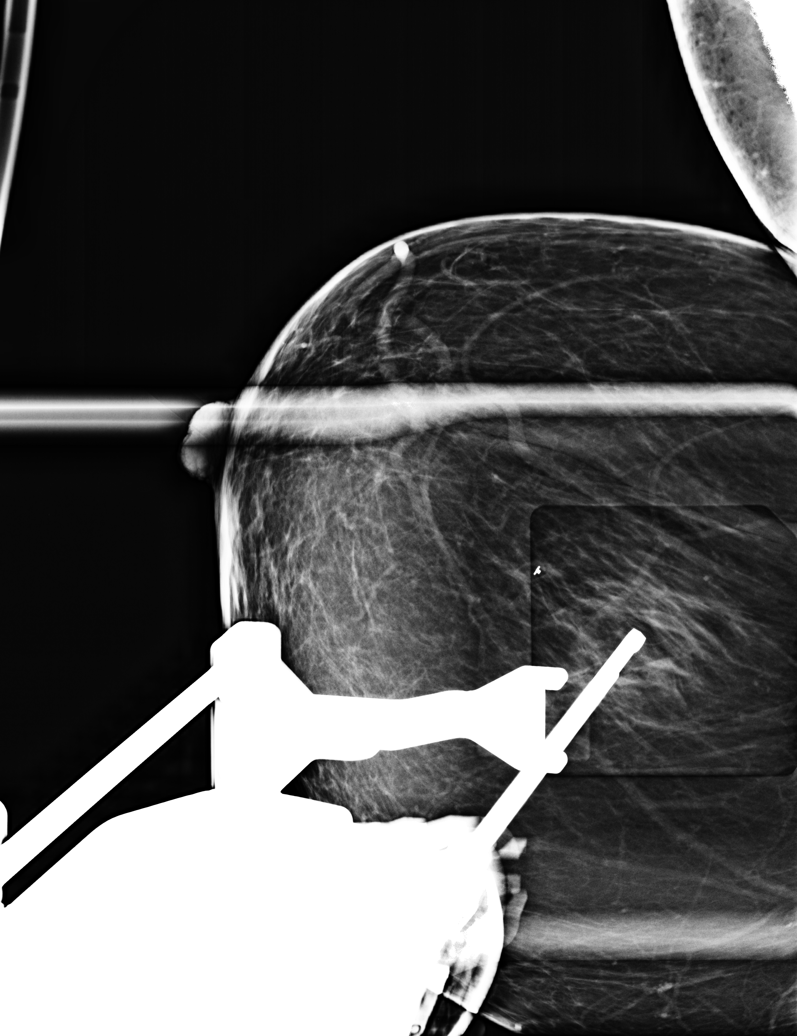

[R LM tomo · tomo slice 34/67.0]
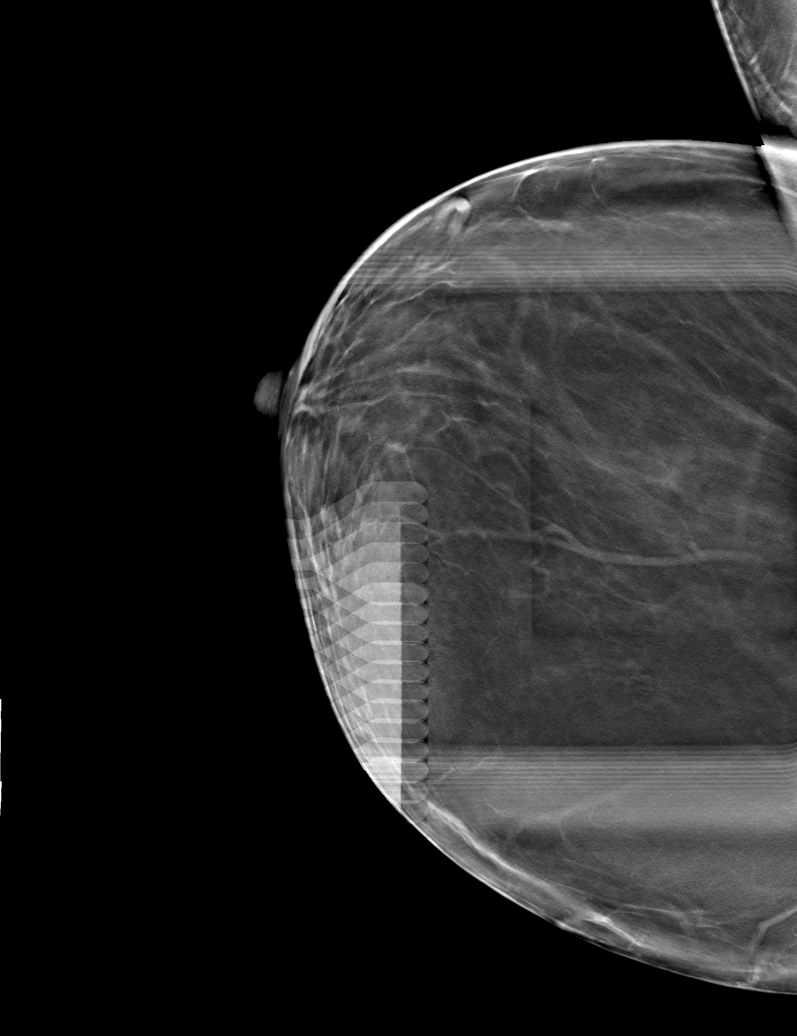

[6 of 35 positions shown; findings below may reference images not displayed]



Using sterile technique and 1% Lidocaine as local anesthetic, under
stereotactic guidance, a 9 gauge vacuum assisted device was used to
perform core needle biopsy of the recently demonstrated 5 mm group
calcifications in the outer right breast using a lateral approach.
Specimen radiograph was performed showing multiple calcifications in
the specimens. Specimens with calcifications are identified for
pathology.

Lesion quadrant: Upper outer quadrant

At the conclusion of the procedure, a coil shaped tissue marker clip
was deployed into the biopsy cavity. Follow-up 2-view mammogram was
performed and dictated separately.
IMPRESSION: Stereotactic-guided biopsy of the recently demonstrated 5 mm group
calcifications in the upper-outer right breast. No apparent
complications.

ADDENDUM:
Pathology revealed FIBROTIC NODULE WITH DYSTROPHIC CALCIFICATIONS of
the RIGHT breast, upper outer quadrant, (coil clip). The
differential includes healed fat necrosis. This was found to be
concordant by Dr. Helmfried Flock.

Pathology results were discussed with the patient by telephone by
Sevastian Volz, RN Nurse Navigator. The patient reported doing well
after the biopsy with tenderness at the site. Post biopsy
instructions and care were reviewed and questions were answered. The
patient was encouraged to call [REDACTED] for any additional concerns.

The patient was instructed to return for annual screening
mammography in August 2022.

Pathology results reported by Aye Miro, RN on 11/10/2021.



Using sterile technique and 1% Lidocaine as local anesthetic, under
stereotactic guidance, a 9 gauge vacuum assisted device was used to
perform core needle biopsy of the recently demonstrated 5 mm group
calcifications in the outer right breast using a lateral approach.
Specimen radiograph was performed showing multiple calcifications in
the specimens. Specimens with calcifications are identified for
pathology.

Lesion quadrant: Upper outer quadrant

At the conclusion of the procedure, a coil shaped tissue marker clip
was deployed into the biopsy cavity. Follow-up 2-view mammogram was
performed and dictated separately.
IMPRESSION: Stereotactic-guided biopsy of the recently demonstrated 5 mm group
calcifications in the upper-outer right breast. No apparent
complications.

## 2024-05-15 ENCOUNTER — Other Ambulatory Visit: Payer: Self-pay | Admitting: Family

## 2024-05-15 DIAGNOSIS — Z1231 Encounter for screening mammogram for malignant neoplasm of breast: Secondary | ICD-10-CM

## 2024-06-12 ENCOUNTER — Ambulatory Visit
Admission: RE | Admit: 2024-06-12 | Discharge: 2024-06-12 | Disposition: A | Discharge status: Home / Self Care | Source: Ambulatory Visit | Attending: Family | Admitting: Family

## 2024-06-12 DIAGNOSIS — Z1231 Encounter for screening mammogram for malignant neoplasm of breast: Secondary | ICD-10-CM
